# Patient Record
Sex: Female | Born: 1970 | Race: Black or African American | Hispanic: No | Marital: Married | Smoking: Never smoker
Health system: Southern US, Community
[De-identification: ages and names within clinical notes are randomized; demographics above are authoritative.]

## PROBLEM LIST (undated history)

## (undated) DIAGNOSIS — D649 Anemia, unspecified: Secondary | ICD-10-CM

## (undated) DIAGNOSIS — I10 Essential (primary) hypertension: Principal | ICD-10-CM

## (undated) DIAGNOSIS — R87629 Unspecified abnormal cytological findings in specimens from vagina: Secondary | ICD-10-CM

## (undated) HISTORY — DX: Morbid (severe) obesity due to excess calories: E66.01

## (undated) HISTORY — DX: Unspecified abnormal cytological findings in specimens from vagina: R87.629

## (undated) HISTORY — PX: TUBAL LIGATION: SHX77

## (undated) HISTORY — PX: KNEE ARTHROSCOPY WITH EXCISION BAKER'S CYST: SHX5646

## (undated) HISTORY — DX: Anemia, unspecified: D64.9

## (undated) HISTORY — DX: Essential (primary) hypertension: I10

---

## 2013-11-13 DIAGNOSIS — N83201 Unspecified ovarian cyst, right side: Secondary | ICD-10-CM | POA: Insufficient documentation

## 2013-12-11 DIAGNOSIS — N870 Mild cervical dysplasia: Secondary | ICD-10-CM | POA: Insufficient documentation

## 2014-08-03 LAB — HM PAP SMEAR: HM PAP: ABNORMAL

## 2015-05-27 ENCOUNTER — Emergency Department (HOSPITAL_BASED_OUTPATIENT_CLINIC_OR_DEPARTMENT_OTHER)
Admission: EM | Admit: 2015-05-27 | Discharge: 2015-05-27 | Disposition: A | Discharge status: Home / Self Care | Payer: No Typology Code available for payment source | Attending: Emergency Medicine | Admitting: Emergency Medicine

## 2015-05-27 ENCOUNTER — Emergency Department (HOSPITAL_BASED_OUTPATIENT_CLINIC_OR_DEPARTMENT_OTHER): Payer: No Typology Code available for payment source

## 2015-05-27 ENCOUNTER — Encounter (HOSPITAL_BASED_OUTPATIENT_CLINIC_OR_DEPARTMENT_OTHER): Payer: Self-pay | Admitting: *Deleted

## 2015-05-27 DIAGNOSIS — Y92410 Unspecified street and highway as the place of occurrence of the external cause: Secondary | ICD-10-CM | POA: Insufficient documentation

## 2015-05-27 DIAGNOSIS — Y9389 Activity, other specified: Secondary | ICD-10-CM | POA: Diagnosis not present

## 2015-05-27 DIAGNOSIS — Z3202 Encounter for pregnancy test, result negative: Secondary | ICD-10-CM | POA: Insufficient documentation

## 2015-05-27 DIAGNOSIS — Y998 Other external cause status: Secondary | ICD-10-CM | POA: Diagnosis not present

## 2015-05-27 DIAGNOSIS — Z79899 Other long term (current) drug therapy: Secondary | ICD-10-CM | POA: Insufficient documentation

## 2015-05-27 DIAGNOSIS — S39012A Strain of muscle, fascia and tendon of lower back, initial encounter: Secondary | ICD-10-CM

## 2015-05-27 DIAGNOSIS — I10 Essential (primary) hypertension: Secondary | ICD-10-CM | POA: Insufficient documentation

## 2015-05-27 DIAGNOSIS — S3992XA Unspecified injury of lower back, initial encounter: Secondary | ICD-10-CM | POA: Diagnosis present

## 2015-05-27 LAB — PREGNANCY, URINE: PREG TEST UR: NEGATIVE

## 2015-05-27 MED ORDER — IBUPROFEN 800 MG PO TABS: 800.0000 mg | ORAL_TABLET | Freq: Three times a day (TID) | ORAL | Status: DC | PRN

## 2015-05-27 MED ORDER — TRAMADOL HCL 50 MG PO TABS: 50.0000 mg | ORAL_TABLET | Freq: Four times a day (QID) | ORAL | Status: DC | PRN

## 2015-05-27 NOTE — Discharge Instructions (Signed)
Return here as needed.  Your x-rays were normal.  Use ice and heat on your lower back
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
## 2016-07-27 ENCOUNTER — Encounter (HOSPITAL_BASED_OUTPATIENT_CLINIC_OR_DEPARTMENT_OTHER): Payer: Self-pay | Admitting: *Deleted

## 2016-07-27 ENCOUNTER — Emergency Department (HOSPITAL_BASED_OUTPATIENT_CLINIC_OR_DEPARTMENT_OTHER)
Admission: EM | Admit: 2016-07-27 | Discharge: 2016-07-27 | Disposition: A | Discharge status: Home / Self Care | Payer: Commercial Managed Care - PPO | Attending: Emergency Medicine | Admitting: Emergency Medicine

## 2016-07-27 DIAGNOSIS — I10 Essential (primary) hypertension: Secondary | ICD-10-CM | POA: Insufficient documentation

## 2016-07-27 DIAGNOSIS — R8290 Unspecified abnormal findings in urine: Secondary | ICD-10-CM | POA: Insufficient documentation

## 2016-07-27 DIAGNOSIS — R1030 Lower abdominal pain, unspecified: Secondary | ICD-10-CM | POA: Diagnosis not present

## 2016-07-27 DIAGNOSIS — E860 Dehydration: Secondary | ICD-10-CM | POA: Diagnosis not present

## 2016-07-27 DIAGNOSIS — R3989 Other symptoms and signs involving the genitourinary system: Secondary | ICD-10-CM

## 2016-07-27 DIAGNOSIS — M545 Low back pain: Secondary | ICD-10-CM | POA: Diagnosis present

## 2016-07-27 DIAGNOSIS — Z79899 Other long term (current) drug therapy: Secondary | ICD-10-CM | POA: Insufficient documentation

## 2016-07-27 LAB — URINALYSIS, ROUTINE W REFLEX MICROSCOPIC
BILIRUBIN URINE: NEGATIVE
Glucose, UA: NEGATIVE mg/dL
HGB URINE DIPSTICK: NEGATIVE
KETONES UR: NEGATIVE mg/dL
Leukocytes, UA: NEGATIVE
Nitrite: NEGATIVE
PROTEIN: NEGATIVE mg/dL
SPECIFIC GRAVITY, URINE: 1.015 (ref 1.005–1.030)
pH: 6 (ref 5.0–8.0)

## 2016-07-27 MED ORDER — IBUPROFEN 400 MG PO TABS
400.0000 mg | ORAL_TABLET | Freq: Once | ORAL | Status: AC
  Administered 2016-07-27: 400 mg via ORAL
  Filled 2016-07-27: qty 1

## 2016-07-27 NOTE — ED Notes (Signed)
ED Provider at bedside. 

## 2016-07-27 NOTE — ED Triage Notes (Signed)
Back pain. Urine has a strong odor.

## 2016-07-27 NOTE — ED Provider Notes (Signed)
[facility] Provider Note   CSN: [id number] Arrival date & time: 07/27/16  1425  By signing my name below, I, Reola Mosher, attest that this documentation has been prepared under the direction and in the presence of Leo Grosser, MD. Electronically Signed: Reola Mosher, ED Scribe. 07/27/16. 5:07 PM.  History   Chief Complaint Chief Complaint  Patient presents with  . Back Pain   The history is provided by the patient. No language interpreter was used.  Back Pain   This is a new problem. The current episode started more than 2 days ago. The problem occurs constantly. The problem has been resolved. The pain is associated with no known injury. The pain is present in the lumbar spine. The pain does not radiate. The pain is moderate. Associated symptoms include abdominal pain (suprapubic). Pertinent negatives include no fever and no dysuria. Treatments tried: Azo. The treatment provided moderate relief.    HPI Comments: Tiffany Peterson is a 46 y.o. female with a h/o HTN, who presents to the Emergency Department complaining of resolved lower back pain beginning several days ago. She reports associated malodorous urine, concentrated urine, and mild suprapubic abdominal pain. She has been taking Azo at home with some moderate, temporary relief. Pt was also given Ibuprofen while in the ED with complete relief. Her pain was exacerbated with generalized movements. Pt has recently been non-compliant with her HTN medications. She denies vaginal discharge/bleeding, dysuria, fever, or any other associated symptoms.   Past Medical History:  Diagnosis Date  . Hypertension    There are no active problems to display for this patient.  History reviewed. No pertinent surgical history.  OB History    No data available     Home Medications    Prior to Admission medications   Medication Sig Start Date End Date Taking? Authorizing Provider  cloNIDine (CATAPRES) 0.1 MG tablet  Take 0.1 mg by mouth 2 (two) times daily.   Yes Historical Provider, MD  metoprolol succinate (TOPROL-XL) 25 MG 24 hr tablet Take 25 mg by mouth daily.   Yes Historical Provider, MD  ibuprofen (ADVIL,MOTRIN) 800 MG tablet Take 1 tablet (800 mg total) by mouth every 8 (eight) hours as needed. 05/27/15   Dalia Heading, PA-C  traMADol (ULTRAM) 50 MG tablet Take 1 tablet (50 mg total) by mouth every 6 (six) hours as needed. 05/27/15   Dalia Heading, PA-C   Family History No family history on file.  Social History Social History  Substance Use Topics  . Smoking status: Never Smoker  . Smokeless tobacco: Never Used  . Alcohol use No   Allergies   Patient has no known allergies.  Review of Systems Review of Systems  Constitutional: Negative for fever.  Gastrointestinal: Positive for abdominal pain (suprapubic).  Genitourinary: Negative for dysuria, vaginal bleeding and vaginal discharge.  Musculoskeletal: Positive for back pain (lower) and myalgias.  All other systems reviewed and are negative.  Physical Exam Updated Vital Signs BP (!) 190/117   Pulse 66   Temp 98.2 F (36.8 C) (Oral)   Resp 20   Ht 5\' 5"  (1.651 m)   Wt 230 lb (104.3 kg)   SpO2 100%   BMI 38.27 kg/m   Physical Exam  Constitutional: She is oriented to person, place, and time. She appears well-developed and well-nourished. No distress.  HENT:  Head: Normocephalic.  Nose: Nose normal.  Eyes: Conjunctivae are normal.  Neck: Neck supple. No tracheal deviation present.  Cardiovascular: Normal rate and  regular rhythm.   Pulmonary/Chest: Effort normal. No respiratory distress.  Abdominal: Soft. She exhibits no distension.  Neurological: She is alert and oriented to person, place, and time.  Skin: Skin is warm and dry.  Psychiatric: She has a normal mood and affect.  Nursing note and vitals reviewed.  ED Treatments / Results  DIAGNOSTIC STUDIES: Oxygen Saturation is 100% on RA, normal by my  interpretation.   COORDINATION OF CARE: 5:05 PM-Discussed next steps with pt. Pt verbalized understanding and is agreeable with the plan.   Labs (all labs ordered are listed, but only abnormal results are displayed) Labs Reviewed  URINALYSIS, ROUTINE W REFLEX MICROSCOPIC   EKG  EKG Interpretation None      Radiology No results found.  Procedures Procedures   Medications Ordered in ED Medications  ibuprofen (ADVIL,MOTRIN) tablet 400 mg (400 mg Oral Given 07/27/16 1447)   Initial Impression / Assessment and Plan / ED Course  I have reviewed the triage vital signs and the nursing notes.  Pertinent labs & imaging results that were available during my care of the patient were reviewed by me and considered in my medical decision making (see chart for details).     46 y.o. female presents with Lower abdominal discomfort and smelly urine that she states feels concentrated. She is premenstrual currently and feels like she gets cramps sometimes with this. She admits that she has not been drinking enough water and she has been noncompliant with her blood pressure medications because she does not like taking pills. She is hypertensive on arrival which is expected. She has complete resolution of her symptoms with ibuprofen, discussed increase water intake and other supportive care measures to help with mild dehydration and stress compliance with prescribed medications which she has a full supply of the upper establishment visit with primary care at [facility].  Final Clinical Impressions(s) / ED Diagnoses   Final diagnoses:  Dark yellow-colored urine  Mild dehydration   New Prescriptions Discharge Medication List as of 07/27/2016  5:33 PM     I personally performed the services described in this documentation, which was scribed in my presence. The recorded information has been reviewed and is accurate.      Leo Grosser, MD 07/27/16 [phone]

## 2016-08-03 ENCOUNTER — Encounter: Payer: Self-pay | Admitting: Family Medicine

## 2016-08-03 ENCOUNTER — Ambulatory Visit (INDEPENDENT_AMBULATORY_CARE_PROVIDER_SITE_OTHER): Payer: Commercial Managed Care - PPO | Admitting: Family Medicine

## 2016-08-03 ENCOUNTER — Other Ambulatory Visit: Payer: Self-pay | Admitting: Family Medicine

## 2016-08-03 VITALS — BP 159/99 | HR 55 | Temp 97.9°F | Ht 65.0 in | Wt 234.4 lb

## 2016-08-03 DIAGNOSIS — R0683 Snoring: Secondary | ICD-10-CM

## 2016-08-03 DIAGNOSIS — Z8639 Personal history of other endocrine, nutritional and metabolic disease: Secondary | ICD-10-CM | POA: Diagnosis not present

## 2016-08-03 DIAGNOSIS — R0681 Apnea, not elsewhere classified: Secondary | ICD-10-CM

## 2016-08-03 DIAGNOSIS — R0609 Other forms of dyspnea: Secondary | ICD-10-CM | POA: Diagnosis not present

## 2016-08-03 DIAGNOSIS — I10 Essential (primary) hypertension: Secondary | ICD-10-CM | POA: Diagnosis not present

## 2016-08-03 MED ORDER — LISINOPRIL 20 MG PO TABS: 20.0000 mg | ORAL_TABLET | Freq: Every day | ORAL | 3 refills | Status: DC

## 2016-08-03 NOTE — Progress Notes (Signed)
Pre visit review using our clinic review tool, if applicable. No additional management support is needed unless otherwise documented below in the visit note. 

## 2016-08-03 NOTE — Patient Instructions (Addendum)
Around 3 times per week, check your blood pressure 4 times per day. Twice in the morning and twice in the evening. The readings should be at least one minute apart. Write down these values and bring them to your next nurse visit/appointment.  When you check your BP, make sure you have been doing something calm/relaxing 5 minutes prior to checking. Both feet should be flat on the floor and you should be sitting. Use your left arm and make sure it is in a relaxed position (on a table), and that the cuff is at the approximate level/height of your heart.  Aim to do some physical exertion for 150 minutes per week. This is typically divided into 5 days per week, 30 minutes per day. The activity should be enough to get your heart rate up. Anything is better than nothing if you have time constraints.  If you don't hear anything in the next 1-2 weeks about your referral to a sleep specialist, call our office for an update.  Healthy Eating Plan Many factors influence your heart health, including eating and exercise habits. Heart (coronary) risk increases with abnormal blood fat (lipid) levels. Heart-healthy meal planning includes limiting unhealthy fats, increasing healthy fats, and making other small dietary changes. This includes maintaining a healthy body weight to help keep lipid levels within a normal range.  WHAT IS MY PLAN?  Your health care provider recommends that you:  Drink a glass of water before meals to help with satiety.  Eat slowly.  An alternative to the water is to add Metamucil. This will help with satiety as well. It does contain calories, unlike water.  WHAT TYPES OF FAT SHOULD I CHOOSE?  Choose healthy fats more often. Choose monounsaturated and polyunsaturated fats, such as olive oil and canola oil, flaxseeds, walnuts, almonds, and seeds.  Eat more omega-3 fats. Good choices include salmon, mackerel, sardines, tuna, flaxseed oil, and ground flaxseeds. Aim to eat fish at least two  times each week.  Avoid foods with partially hydrogenated oils in them. These contain trans fats. Examples of foods that contain trans fats are stick margarine, some tub margarines, cookies, crackers, and other baked goods. If you are going to avoid a fat, this is the one to avoid!  WHAT GENERAL GUIDELINES DO I NEED TO FOLLOW?  Check food labels carefully to identify foods with trans fats. Avoid these types of options when possible.  Fill one half of your plate with vegetables and green salads. Eat 4-5 servings of vegetables per day. A serving of vegetables equals 1 cup of raw leafy vegetables,  cup of raw or cooked cut-up vegetables, or  cup of vegetable juice.  Fill one fourth of your plate with whole grains. Look for the word "whole" as the first word in the ingredient list.  Fill one fourth of your plate with lean protein foods.  Eat 4-5 servings of fruit per day. A serving of fruit equals one medium whole fruit,  cup of dried fruit,  cup of fresh, frozen, or canned fruit. Try to avoid fruits in cups/syrups as the sugar content can be high.  Eat more foods that contain soluble fiber. Examples of foods that contain this type of fiber are apples, broccoli, carrots, beans, peas, and barley. Aim to get 20-30 g of fiber per day.  Eat more home-cooked food and less restaurant, buffet, and fast food.  Limit or avoid alcohol.  Limit foods that are high in starch and sugar.  Avoid fried foods when  able.  [place] foods by using methods other than frying. Baking, boiling, grilling, and broiling are all great options. Other fat-reducing suggestions include: ? Removing the skin from poultry. ? Removing all visible fats from meats. ? Skimming the fat off of stews, soups, and gravies before serving them. ? Steaming vegetables in water or broth.  Lose weight if you are overweight. Losing just 5-10% of your initial body weight can help your overall health and prevent diseases such as diabetes and  heart disease.  Increase your consumption of nuts, legumes, and seeds to 4-5 servings per week. One serving of dried beans or legumes equals  cup after being cooked, one serving of nuts equals 1 ounces, and one serving of seeds equals  ounce or 1 tablespoon.  WHAT ARE GOOD FOODS CAN I EAT? Grains Grainy breads (try to find bread that is 3 g of fiber per slice or greater), oatmeal, light popcorn. Whole-grain cereals. Rice and pasta, including brown rice and those that are made with whole wheat. Edamame pasta is a great alternative to grain pasta. It has a higher protein content. Try to avoid significant consumption of white bread, sugary cereals, or pastries/baked goods.  Vegetables All vegetables. Cooked white potatoes do not count as vegetables.  Fruits All fruits, but limit pineapple and bananas as these fruits have a higher sugar content.  Meats and Other Protein Sources Lean, well-trimmed beef, veal, pork, and lamb. Chicken and [place] without skin. All fish and shellfish. Wild duck, rabbit, pheasant, and venison. Egg whites or low-cholesterol egg substitutes. Dried beans, peas, lentils, and tofu.Seeds and most nuts.  Dairy Low-fat or nonfat cheeses, including ricotta, string, and mozzarella. Skim or 1% milk that is liquid, powdered, or evaporated. Buttermilk that is made with low-fat milk. Nonfat or low-fat yogurt. Soy/Almond milk are good alternatives if you cannot handle dairy.  Beverages Water is the best for you. Sports drinks with less sugar are more desirable unless you are a highly active athlete.  Sweets and Desserts Sherbets and fruit ices. Honey, jam, marmalade, jelly, and syrups. Dark chocolate.  Eat all sweets and desserts in moderation.  Fats and Oils Nonhydrogenated (trans-free) margarines. Vegetable oils, including soybean, sesame, sunflower, olive, peanut, safflower, corn, canola, and cottonseed. Salad dressings or mayonnaise that are made with a vegetable oil.  Limit added fats and oils that you use for cooking, baking, salads, and as spreads.  Other Cocoa powder. Coffee and tea. Most condiments.  The items listed above may not be a complete list of recommended foods or beverages. Contact your dietitian for more options.

## 2016-08-03 NOTE — Progress Notes (Signed)
Chief Complaint  Patient presents with  . Establish Care    Pt reports having HTN and needs to be put on medication       New Patient Visit SUBJECTIVE: HPI: Tiffany Peterson is an 46 y.o.female who is being seen for establishing care.  Hypertension Patient has hx of HTN. She does not routinely monitor home blood pressures. She is compliant with medications- Clonidine BID and Metoprolol 25 mg. Patient has these side effects of medication: none She is not adhering to a healthy diet overall. Exercise: None She has had a tubal ligation. Denies chest pain, she does have some shortness of breath with physical exertion. Of note, she has been experiencing a strong urge to eat ice. She has a hx of Fe deficiency.  No Known Allergies  Past Medical History:  Diagnosis Date  . Hypertension    Past Surgical History:  Procedure Laterality Date  . NO PAST SURGERIES     Social History   Social History  . Marital status: Married   Social History Main Topics  . Smoking status: Never Smoker  . Smokeless tobacco: Never Used  . Alcohol use No  . Drug use: Unknown   Family History  Problem Relation Age of Onset  . Cancer Neg Hx      Current Outpatient Prescriptions:  .  metoprolol succinate (TOPROL-XL) 25 MG 24 hr tablet, Take 25 mg by mouth daily., Disp: , Rfl:  .  lisinopril (PRINIVIL,ZESTRIL) 20 MG tablet, Take 1 tablet (20 mg total) by mouth daily., Disp: 90 tablet, Rfl: 3  Patient's last menstrual period was 06/22/2016.  ROS Cardiovascular: Denies chest pain  Respiratory: Denies current SOB   OBJECTIVE: BP (!) 159/99 (BP Location: Left Arm, Patient Position: Sitting, Cuff Size: Large)   Pulse (!) 55   Temp 97.9 F (36.6 C) (Oral)   Ht 5\' 5"  (1.651 m)   Wt 234 lb 6.4 oz (106.3 kg)   LMP 06/22/2016   SpO2 98%   BMI 39.01 kg/m   Constitutional: -  VS reviewed -  Well developed, well nourished, appears stated age -  No apparent distress  Psychiatric: -  Oriented to  person, place, and time -  Memory intact -  Affect and mood normal -  Fluent conversation, good eye contact -  Judgment and insight age appropriate  Eye: -  Conjunctivae clear, no discharge -  Pupils symmetric, round, reactive to light  ENMT: -  Oral mucosa without lesions, tongue and uvula midline    Tonsils not enlarged, no erythema, no exudate, trachea midline    Pharynx moist, no lesions, no erythema  Neck: -  No gross swelling, no palpable masses -  Thyroid midline, not enlarged, mobile, no palpable masses  Cardiovascular: -  RRR, no murmurs -  No LE edema  Respiratory: -  Normal respiratory effort, no accessory muscle use, no retraction -  Breath sounds equal, no wheezes, no ronchi, no crackles  Gastrointestinal: -  Bowel sounds normal -  No tenderness, no distention, no guarding, no masses  Musculoskeletal: -  No clubbing, no cyanosis -  Gait normal  Skin: -  No significant lesion on inspection -  Warm and dry to palpation   ASSESSMENT/PLAN: Essential hypertension - Plan: Basic Metabolic Panel (BMET), Basic Metabolic Panel (BMET)  Snoring - Plan: Ambulatory referral to Pulmonology  Witnessed apneic spells - Plan: Ambulatory referral to Pulmonology  DOE (dyspnea on exertion) - Plan: CBC, EKG 12-Lead  History of iron deficiency - Plan:  CBC, IBC panel, Ferritin  Pt follows with OB/GYN for women's health and has yearly mammograms through work. She also gets routine lab work through her job. She will get our office copies of her most recent labs. Orders as above. Stop clonidine, add lisinopril. Counseled on diet and exercise. Losing weight could affect likely sleep apnea which could also be affecting her blood pressure. She will start keeping track of her blood pressure home and bring both her monitor and log to her next appointment. Patient should return in 1 week for labs, 2 weeks to see me for blood pressure. The patient voiced understanding and agreement to the  plan.   [place], DO 08/03/16  4:47 PM

## 2016-08-04 LAB — CBC
HEMATOCRIT: 33.5 % — AB (ref 36.0–46.0)
Hemoglobin: 10.3 g/dL — ABNORMAL LOW (ref 12.0–15.0)
MCHC: 30.7 g/dL (ref 30.0–36.0)
PLATELETS: 336 10*3/uL (ref 150.0–400.0)
RBC: 4.92 Mil/uL (ref 3.87–5.11)
RDW: 19 % — ABNORMAL HIGH (ref 11.5–15.5)
WBC: 6.4 10*3/uL (ref 4.0–10.5)

## 2016-08-04 LAB — BASIC METABOLIC PANEL
BUN: 14 mg/dL (ref 6–23)
CHLORIDE: 104 meq/L (ref 96–112)
CO2: 23 meq/L (ref 19–32)
Calcium: 8.8 mg/dL (ref 8.4–10.5)
Creatinine, Ser: 0.86 mg/dL (ref 0.40–1.20)
GFR: 90.58 mL/min (ref >60.00)
GLUCOSE: 78 mg/dL (ref 70–99)
POTASSIUM: 4.2 meq/L (ref 3.5–5.1)
SODIUM: 136 meq/L (ref 135–145)

## 2016-08-04 LAB — FERRITIN: Ferritin: 6 ng/mL — ABNORMAL LOW (ref 10.0–291.0)

## 2016-08-04 LAB — IBC PANEL
Iron: 14 ug/dL — ABNORMAL LOW (ref 42–145)
Saturation Ratios: 2.9 % — ABNORMAL LOW (ref 20.0–50.0)
Transferrin: 346 mg/dL (ref 212.0–360.0)

## 2016-08-10 ENCOUNTER — Other Ambulatory Visit (INDEPENDENT_AMBULATORY_CARE_PROVIDER_SITE_OTHER): Payer: Commercial Managed Care - PPO

## 2016-08-10 DIAGNOSIS — I10 Essential (primary) hypertension: Secondary | ICD-10-CM | POA: Diagnosis not present

## 2016-08-11 LAB — BASIC METABOLIC PANEL WITH GFR
BUN: 16 mg/dL (ref 6–23)
CO2: 23 meq/L (ref 19–32)
Calcium: 9.2 mg/dL (ref 8.4–10.5)
Chloride: 105 meq/L (ref 96–112)
Creatinine, Ser: 0.92 mg/dL (ref 0.40–1.20)
GFR: 83.8 mL/min
Glucose, Bld: 77 mg/dL (ref 70–99)
Potassium: 4.4 meq/L (ref 3.5–5.1)
Sodium: 139 meq/L (ref 135–145)

## 2016-08-12 ENCOUNTER — Encounter: Payer: Self-pay | Admitting: Family Medicine

## 2016-08-12 ENCOUNTER — Telehealth: Payer: Self-pay | Admitting: Family Medicine

## 2016-08-12 NOTE — Telephone Encounter (Addendum)
Labs-08/10/16 Lipid panel: Tot chol 157, HDL 53, TGs 51; 10 yr CVD is 3.0% CMP unremarkable A1c 5.8 CBC: Hb 9, Hct 31.9, MCV 66.7, RDW 18.8 Vit D: 10 TSH WNL

## 2016-08-17 ENCOUNTER — Encounter: Payer: Self-pay | Admitting: *Deleted

## 2016-08-19 ENCOUNTER — Ambulatory Visit (INDEPENDENT_AMBULATORY_CARE_PROVIDER_SITE_OTHER): Payer: Commercial Managed Care - PPO | Admitting: Family Medicine

## 2016-08-19 ENCOUNTER — Encounter: Payer: Self-pay | Admitting: Family Medicine

## 2016-08-19 VITALS — BP 161/92 | HR 64 | Temp 98.3°F | Resp 16 | Ht 65.0 in | Wt 233.0 lb

## 2016-08-19 DIAGNOSIS — I10 Essential (primary) hypertension: Secondary | ICD-10-CM | POA: Diagnosis not present

## 2016-08-19 HISTORY — DX: Essential (primary) hypertension: I10

## 2016-08-19 MED ORDER — CHLORTHALIDONE 25 MG PO TABS: 25.0000 mg | ORAL_TABLET | Freq: Every day | ORAL | 2 refills | Status: DC

## 2016-08-19 NOTE — Progress Notes (Signed)
Chief Complaint  Patient presents with  . Hypertension    Subjective Tiffany Peterson is a 46 y.o. female who presents for hypertension follow up. She does monitor home blood pressures.  Blood pressures ranging from 140-150's/90-100's on average. She is compliant with medications- Metoprolol XR 25 mg daily, Lisinopril 20 mg daily. Patient has these side effects of medication: none She is not adhering to a healthy diet overall. Current exercise: none; was counseled on waking up earlier to work out before work but she needs to reports at 6 AM   Past Medical History:  Diagnosis Date  . Essential hypertension 08/19/2016  . Hypertension    Family History  Problem Relation Age of Onset  . Cancer Neg Hx    Medications Current Outpatient Prescriptions on File Prior to Visit  Medication Sig Dispense Refill  . lisinopril (PRINIVIL,ZESTRIL) 20 MG tablet Take 1 tablet (20 mg total) by mouth daily. 90 tablet 3  . metoprolol succinate (TOPROL-XL) 25 MG 24 hr tablet Take 25 mg by mouth daily.     Allergies No Known Allergies  Review of Systems Cardiovascular: no chest pain Respiratory:  no shortness of breath  Exam BP (!) 161/92 (BP Location: Left Arm)   Pulse 64   Temp 98.3 F (36.8 C) (Oral)   Resp 16   Ht 5\' 5"  (1.651 m)   Wt 233 lb (105.7 kg)   SpO2 100%   BMI 38.77 kg/m  General:  well developed, well nourished, in no apparent distress Skin:  warm, no pallor or diaphoresis Heart :RRR, no murmurs, no bruits, no LE edema Lungs:  clear to auscultation, no accessory muscle use Psych: well oriented with normal range of affect and appropriate judgment/insight  Essential hypertension - Plan: chlorthalidone (HYGROTON) 25 MG tablet, Basic metabolic panel, Basic metabolic panel  Uncontrolled. Orders as above. Add chlorthalidone. BMP today and in 1 week for monitoring renal function and K. Counseled on diet and exercise. Give me something for exercise, even if for 1 min.  F/u in 1  mo. The patient voiced understanding and agreement to the plan.  [place], DO 08/19/16  4:27 PM

## 2016-08-19 NOTE — Patient Instructions (Signed)
Consider cutting down on your lemonade consumption.  Start exercising daily.

## 2016-08-20 LAB — BASIC METABOLIC PANEL
BUN: 16 mg/dL (ref 6–23)
CHLORIDE: 104 meq/L (ref 96–112)
CO2: 29 meq/L (ref 19–32)
Calcium: 9.1 mg/dL (ref 8.4–10.5)
Creatinine, Ser: 0.86 mg/dL (ref 0.40–1.20)
GFR: 90.57 mL/min (ref >60.00)
GLUCOSE: 100 mg/dL — AB (ref 70–99)
Potassium: 4.4 mEq/L (ref 3.5–5.1)
SODIUM: 137 meq/L (ref 135–145)

## 2016-09-14 ENCOUNTER — Encounter: Payer: Self-pay | Admitting: Pulmonary Disease

## 2016-09-14 ENCOUNTER — Ambulatory Visit (INDEPENDENT_AMBULATORY_CARE_PROVIDER_SITE_OTHER): Payer: Commercial Managed Care - PPO | Admitting: Pulmonary Disease

## 2016-09-14 DIAGNOSIS — R0981 Nasal congestion: Secondary | ICD-10-CM

## 2016-09-14 DIAGNOSIS — G4700 Insomnia, unspecified: Secondary | ICD-10-CM | POA: Diagnosis not present

## 2016-09-14 DIAGNOSIS — G4710 Hypersomnia, unspecified: Secondary | ICD-10-CM

## 2016-09-14 NOTE — Assessment & Plan Note (Signed)
Patient with sleep onset and sleep maintenance insomnia most likely from untreated sleep apnea. She takes Ambien every now and then but has abnormal behavior with Ambien use. Sleep talking and drove once.  I recommended to hold off on the Ambien. Hopefully, once on CPAP, her insomnia will improve. May try Benadryl for sleep onset insomnia.

## 2016-09-14 NOTE — Assessment & Plan Note (Signed)
Has chronic sinus issues.  Start claritin daily.

## 2016-09-14 NOTE — Assessment & Plan Note (Addendum)
Pt  has snoring, gasping, choking, witnessed apneas, has frequent awakenings, has issues falling and staying asleep. She sleeps from 7pm - 5am but is awake for the most part.  Has fatigue and hypersomnia when she wakes up in the morning. Denies HA in am. Gtes sleepy in pm.  Naps in pm. Hypersomnia affects her fxnality.  She sleep talks.  No other abn behavior in sleep.   She takes [place] and does abnormal behavior in sleep (talk, walk, conversing). Last time she took it was 4 months ago. Has issues falling asleep.   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   ESS 17.    She has chronic sinus issues.    Plan :  We discussed about the diagnosis of Obstructive Sleep Apnea (OSA) and implications of untreated OSA. We discussed about CPAP and BiPaP as possible treatment options.    We will schedule the patient for a sleep study. Plan for a HST. Very symptomatic. Anticipate no issues with CPAP. She also has abnormal behavior with Ambien. Hopefully, with CPAP and treatment of sleep apnea, she will not necessarily need sleep aids. Has sinus issues for which we will start claritin.    Patient was instructed to call the office if he/she has not heard back from the office 1-2 weeks after the sleep study.   Patient was instructed to call the office if he/she is having issues with the PAP device.   We discussed good sleep hygiene.   Patient was advised not to engage in activities requiring concentration and/or vigilance if he/she is sleepy.  Patient was advised not to drive if he/she is sleepy.

## 2016-09-14 NOTE — Patient Instructions (Signed)

## 2016-09-14 NOTE — Progress Notes (Signed)
Subjective:    Patient ID: Tiffany Peterson, female    DOB: [birth year], 46 y.o.   MRN: [id number]  HPI   This is the case of Tiffany Peterson, 46 y.o. Female, who was referred by Dr. Riki Sheer in consultation regarding possible OSA.   As you very well know, patient does not smoke, denies asthma or copd. She has snoring, gasping, choking, witnessed apneas, has frequent awakenings, has issues falling and staying asleep. She sleeps from 7pm - 5am but is awake for the most part.  Has fatigue and hypersomnia when she wakes up in the morning. Denies HA in am. Gtes sleepy in pm.  Naps in pm. Hypersomnia affects her fxnality.  She sleep talks.  No other abn behavior in sleep.   She takes [place] and does abnormal behavior in sleep (talk, walk, conversing). Last time she took it was 4 months ago. Has issues falling asleep.   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   ESS 17.    She has chronic sinus issues.   Review of Systems  Constitutional: Negative.  Negative for fever and unexpected weight change.  HENT: Negative.  Negative for congestion, dental problem, ear pain, nosebleeds, postnasal drip, rhinorrhea, sinus pressure, sneezing, sore throat and trouble swallowing.   Eyes: Negative.  Negative for redness and itching.  Respiratory: Positive for shortness of breath. Negative for cough, chest tightness and wheezing.   Cardiovascular: Negative.  Negative for palpitations and leg swelling.  Gastrointestinal: Negative.  Negative for nausea and vomiting.  Endocrine: Negative.   Genitourinary: Negative.  Negative for dysuria.  Musculoskeletal: Negative.  Negative for joint swelling.  Skin: Negative.  Negative for rash.  Allergic/Immunologic: Negative.  Negative for environmental allergies, food allergies and immunocompromised state.  Neurological: Negative.  Negative for headaches.  Hematological: Bruises/bleeds easily.  Psychiatric/Behavioral: Negative.  Negative  for dysphoric mood. The patient is not nervous/anxious.    Past Medical History:  Diagnosis Date  . Essential hypertension 08/19/2016  . Hypertension    (-) CA, DVT.   Family History  Problem Relation Age of Onset  . Cancer Neg Hx      Past Surgical History:  Procedure Laterality Date  . TUBAL LIGATION      Social History   Social History  . Marital status: Married    Spouse name: N/A  . Number of children: N/A  . Years of education: N/A   Occupational History  . Not on file.   Social History Main Topics  . Smoking status: Never Smoker  . Smokeless tobacco: Never Used  . Alcohol use No  . Drug use: Unknown  . Sexual activity: Not on file   Other Topics Concern  . Not on file   Social History Narrative  . No narrative on file   Lives in [place].   No Known Allergies   Outpatient Medications Prior to Visit  Medication Sig Dispense Refill  . chlorthalidone (HYGROTON) 25 MG tablet Take 1 tablet (25 mg total) by mouth daily. 30 tablet 2  . lisinopril (PRINIVIL,ZESTRIL) 20 MG tablet Take 1 tablet (20 mg total) by mouth daily. (Patient not taking: Reported on 09/14/2016) 90 tablet 3  . metoprolol succinate (TOPROL-XL) 25 MG 24 hr tablet Take 25 mg by mouth daily.     No facility-administered medications prior to visit.    No orders of the defined types were placed in this encounter.       Objective:   Physical  Exam  Vitals:  Vitals:   09/14/16 1438  BP: 132/88  Pulse: 63  SpO2: 98%  Weight: 231 lb 3.2 oz (104.9 kg)  Height: 5\' 5"  (1.651 m)    Constitutional/General:  Pleasant, well-nourished, well-developed, not in any distress,  Comfortably seating.  Well kempt  Body mass index is 38.47 kg/m. Wt Readings from Last 3 Encounters:  09/14/16 231 lb 3.2 oz (104.9 kg)  08/19/16 233 lb (105.7 kg)  08/03/16 234 lb 6.4 oz (106.3 kg)     HEENT: Pupils equal and reactive to light and accommodation. Anicteric sclerae. Normal nasal mucosa.   No oral   lesions,  mouth clear,  oropharynx clear, no postnasal drip. (-) Oral thrush. No dental caries.  Airway - Mallampati class III- IV  Neck: No masses. Midline trachea. No JVD, (-) LAD. (-) bruits appreciated.  Respiratory/Chest: Grossly normal chest. (-) deformity. (-) Accessory muscle use.  Symmetric expansion. (-) Tenderness on palpation.  Resonant on percussion.  Diminished BS on both lower lung zones. (-) wheezing, crackles, rhonchi (-) egophony  Cardiovascular: Regular rate and  rhythm, heart sounds normal, no murmur or gallops, no peripheral edema  Gastrointestinal:  Normal bowel sounds. Soft, non-tender. No hepatosplenomegaly.  (-) masses.   Musculoskeletal:  Normal muscle tone. Normal gait.   Extremities: Grossly normal. (-) clubbing, cyanosis.  (-) edema  Skin: (-) rash,lesions seen.   Neurological/Psychiatric : alert, oriented to time, place, person. Normal mood and affect          Assessment & Plan:  Hypersomnia Pt  has snoring, gasping, choking, witnessed apneas, has frequent awakenings, has issues falling and staying asleep. She sleeps from 7pm - 5am but is awake for the most part.  Has fatigue and hypersomnia when she wakes up in the morning. Denies HA in am. Gtes sleepy in pm.  Naps in pm. Hypersomnia affects her fxnality.  She sleep talks.  No other abn behavior in sleep.   She takes [place] and does abnormal behavior in sleep (talk, walk, conversing). Last time she took it was 4 months ago. Has issues falling asleep.   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   ESS 17.    She has chronic sinus issues.    Plan :  We discussed about the diagnosis of Obstructive Sleep Apnea (OSA) and implications of untreated OSA. We discussed about CPAP and BiPaP as possible treatment options.    We will schedule the patient for a sleep study. Plan for a HST. Very symptomatic. Anticipate no issues with CPAP. She also has abnormal behavior with  Ambien. Hopefully, with CPAP and treatment of sleep apnea, she will not necessarily need sleep aids. Has sinus issues for which we will start claritin.    Patient was instructed to call the office if he/she has not heard back from the office 1-2 weeks after the sleep study.   Patient was instructed to call the office if he/she is having issues with the PAP device.   We discussed good sleep hygiene.   Patient was advised not to engage in activities requiring concentration and/or vigilance if he/she is sleepy.  Patient was advised not to drive if he/she is sleepy.    Sinus congestion Has chronic sinus issues.  Start claritin daily.   Insomnia Patient with sleep onset and sleep maintenance insomnia most likely from untreated sleep apnea. She takes Ambien every now and then but has abnormal behavior with Ambien use. Sleep talking and drove once.  I recommended to hold off on the Ambien. Hopefully, once on CPAP, her insomnia will improve. May try Benadryl for sleep onset insomnia.     Thank you very much for letting me participate in this patient's care. Please do not hesitate to give me a call if you have any questions or concerns regarding the treatment plan.   Patient will follow up with me in 8-10 weeks.     Monica Becton, MD 09/14/2016   3:20 PM [facility] [place] Pager: [phone] Office: [phone], Fax: [phone]

## 2016-09-18 ENCOUNTER — Ambulatory Visit (INDEPENDENT_AMBULATORY_CARE_PROVIDER_SITE_OTHER): Payer: Commercial Managed Care - PPO | Admitting: Family Medicine

## 2016-09-18 ENCOUNTER — Encounter: Payer: Self-pay | Admitting: Family Medicine

## 2016-09-18 VITALS — BP 168/108 | HR 69 | Temp 98.3°F | Ht 65.0 in | Wt 229.6 lb

## 2016-09-18 DIAGNOSIS — Z23 Encounter for immunization: Secondary | ICD-10-CM

## 2016-09-18 DIAGNOSIS — I10 Essential (primary) hypertension: Secondary | ICD-10-CM

## 2016-09-18 NOTE — Addendum Note (Signed)
Addended by: Harl Bowie on: 09/18/2016 04:14 PM   Modules accepted: Orders

## 2016-09-18 NOTE — Patient Instructions (Addendum)
Start back on Lisinopril and Toprol. Continue with Chlorthalidone also.  Keep checking your blood pressure. Bring your tablet and your monitor to your appointment so we can compare and make sure your home monitor is accurate. Follow up is based on what your readings are and the nurse visit.  We are always here if you need [place].

## 2016-09-18 NOTE — Progress Notes (Signed)
Pre visit review using our clinic review tool, if applicable. No additional management support is needed unless otherwise documented below in the visit note. 

## 2016-09-18 NOTE — Progress Notes (Addendum)
Chief Complaint  Patient presents with  . Follow-up    1 mos-on BP-pt forgot the BP readings    Subjective Tiffany Peterson is a 46 y.o. female who presents for hypertension follow up. She does monitor home blood pressures. Blood pressures ranging from 160's/100's on average. She is compliant with medications-recently started on Chlorthalidone 25 mg daily.   Patient has these side effects of medication: none She is sometimes adhering to a healthy diet overall. Current exercise: none     Past Medical History:  Diagnosis Date  . Essential hypertension 08/19/2016   Family History  Problem Relation Age of Onset  . Cancer Neg Hx      Medications Current Outpatient Prescriptions on File Prior to Visit  Medication Sig Dispense Refill  . chlorthalidone (HYGROTON) 25 MG tablet Take 1 tablet (25 mg total) by mouth daily. 30 tablet 2  . lisinopril (PRINIVIL,ZESTRIL) 20 MG tablet Take 1 tablet (20 mg total) by mouth daily. (Patient not taking: Reported on 09/14/2016) 90 tablet 3  . metoprolol succinate (TOPROL-XL) 25 MG 24 hr tablet Take 25 mg by mouth daily.     Allergies No Known Allergies  Review of Systems Cardiovascular: no chest pain Respiratory:  no shortness of breath  Exam BP (!) 168/108 (BP Location: Left Wrist, Patient Position: Sitting, Cuff Size: Normal)   Pulse 69   Temp 98.3 F (36.8 C) (Oral)   Ht 5\' 5"  (1.651 m)   Wt 229 lb 9.6 oz (104.1 kg)   LMP 08/26/2016 (Approximate)   SpO2 99%   BMI 38.21 kg/m  General:  well developed, well nourished, in no apparent distress Skin:  warm, no pallor or diaphoresis Eyes:  pupils equal and round, sclera anicteric without injection Heart :RRR, no murmurs, no bruits, no LE edema Lungs:  clear to auscultation, no accessory muscle use Psych: well oriented with normal range of affect and appropriate judgment/insight, did become tearful during exam when briefly bringing up PHQ screening  Essential hypertension  Status:  uncontrolled Accidental noncompliance, will restart medicine- Lisinopril and Toprol XL. Continue on Chlorthalidone. Counseled on diet and exercise Pt's PHQ-2 suggestive of at least discussion for depression, pt did not wish to discuss this at today's visit. I iterated that we will be here if/when she ever decides to discuss the issue at hand. F/u in 2 weeks for nurse visit, bring tablet with readings and home BP monitor. The patient voiced understanding and agreement to the plan.  [place], DO 09/18/16  3:19 PM

## 2016-10-02 ENCOUNTER — Other Ambulatory Visit: Payer: Self-pay

## 2016-10-02 ENCOUNTER — Ambulatory Visit (INDEPENDENT_AMBULATORY_CARE_PROVIDER_SITE_OTHER): Payer: Commercial Managed Care - PPO | Admitting: Family Medicine

## 2016-10-02 DIAGNOSIS — I10 Essential (primary) hypertension: Secondary | ICD-10-CM

## 2016-10-02 MED ORDER — LOSARTAN POTASSIUM 100 MG PO TABS: 100.0000 mg | ORAL_TABLET | Freq: Every day | ORAL | 3 refills | Status: DC

## 2016-10-02 NOTE — Progress Notes (Signed)
BP is greatly improved. Change ACEi to ARB given scratchy cough. Agree with above otherwise.

## 2016-10-02 NOTE — Patient Instructions (Signed)
Per Dr. Nani Ravens patient to stop Lisnopril 20 mg and start Losartan 100 mg daily along with Chlorthalidone 25 mg daily. Return for BP Nurse visit in 1 month. Have Home BP monitor  re-calibrated or consider purchasing new one.   Appointment scheduled for Nov 03, 2016 @ 3:00 pm

## 2016-10-02 NOTE — Progress Notes (Signed)
Pre visit review using our clinic tool,if applicable. No additional management support is needed unless otherwise documented below in the visit note. 20 mg started  Patient in fore BP check today per order from Dr. Deloris Ping dated 09/18/16.  Last BP = 168/108 Pulse = 69. Lisinopril 20 mg qd and Toprol XL 25 mg re-started contiued Chlorthalidone 25 mg daily.  Patient states she did not re-start Toprol - XL.  States she has developed a scratchy throat since starting Lisinopril.  BP today =  131/81 P = 63    Patient BP Cuff BP=135/100  P= 62  Per Dr. Nani Ravens patient to stop Lisnopril 20 mg and start Losartan 100 mg daily along with Chlorthalidone 25 mg daily. Return for BP Nurse visit in 1 month. Have Home BP monitor  re-calibrated or consider purchasing new one.

## 2016-10-12 DIAGNOSIS — G4733 Obstructive sleep apnea (adult) (pediatric): Secondary | ICD-10-CM | POA: Diagnosis not present

## 2016-10-16 ENCOUNTER — Other Ambulatory Visit: Payer: Self-pay | Admitting: *Deleted

## 2016-10-16 ENCOUNTER — Telehealth: Payer: Self-pay | Admitting: Pulmonary Disease

## 2016-10-16 DIAGNOSIS — G4710 Hypersomnia, unspecified: Secondary | ICD-10-CM

## 2016-10-16 DIAGNOSIS — G4733 Obstructive sleep apnea (adult) (pediatric): Secondary | ICD-10-CM | POA: Diagnosis not present

## 2016-10-16 NOTE — Telephone Encounter (Signed)
    Please call the pt and tell the pt the [place]   did not show OSA.   Pt stops breathing  3  times an hour.   Home sleep study was done on : 10/12/16  We need to make sure she does not have OSA so I recommend a sleep lab study.  Pls schedule a lab study if it will be OK with the pt.     Thanks!   J. Shirl Harris, MD 10/16/2016, 5:14 AM

## 2016-10-19 NOTE — Telephone Encounter (Signed)
Spoke with pt and informed her of her sleep study results per AD. Pt at this time decided to hold off repeat sleep study at this time. She will call us if she needs anything further

## 2016-11-16 ENCOUNTER — Ambulatory Visit: Payer: Commercial Managed Care - PPO | Admitting: Adult Health
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
## 2018-09-07 ENCOUNTER — Other Ambulatory Visit: Payer: Self-pay

## 2018-09-07 ENCOUNTER — Ambulatory Visit (INDEPENDENT_AMBULATORY_CARE_PROVIDER_SITE_OTHER): Payer: Commercial Managed Care - PPO | Admitting: Family Medicine

## 2018-09-07 ENCOUNTER — Encounter: Payer: Self-pay | Admitting: Family Medicine

## 2018-09-07 DIAGNOSIS — I10 Essential (primary) hypertension: Secondary | ICD-10-CM

## 2018-09-07 DIAGNOSIS — R0600 Dyspnea, unspecified: Secondary | ICD-10-CM

## 2018-09-07 MED ORDER — AMLODIPINE BESYLATE 5 MG PO TABS: 5.0000 mg | ORAL_TABLET | Freq: Every day | ORAL | 3 refills | Status: DC

## 2018-09-07 NOTE — Progress Notes (Signed)
Virtual Visit via Telephone Note  I connected with Tiffany Peterson on 09/07/18 at  1:30 PM EDT by telephone and verified that I am speaking with the correct person using two identifiers.   I discussed the limitations, risks, security and privacy concerns of performing an evaluation and management service by telephone and the availability of in person appointments. I also discussed with the patient that there may be a patient responsible charge related to this service. The patient expressed understanding and agreed to proceed.   History of Present Illness: BP med f/u. Was asked to return for check in 2018 but was lost to f/u. Does not ck BP at home. Gets HA from high BP. Has not been taking meds at home. Shortness of breath for many mo, exertional. Denies CP, COPD, asthma, or smoking. Has gained wt.    Observations/Objective: No conversational dyspnea. Age appropriate judgment and insight Nml affect and mood  Assessment and Plan: Essential hypertension Norvasc 5 mg/d, counseled on diet and exercise. F/u in 2 weeks, EKG, potential labs  Follow Up Instructions: 2 weeks   I discussed the assessment and treatment plan with the patient. The patient was provided an opportunity to ask questions and all were answered. The patient agreed with the plan and demonstrated an understanding of the instructions.   The patient was advised to call back or seek an in-person evaluation if the symptoms worsen or if the condition fails to improve as anticipated.  I provided 11 minutes of non-face-to-face time during this encounter.   [place], DO

## 2018-09-13 ENCOUNTER — Telehealth: Payer: Self-pay | Admitting: Family Medicine

## 2018-09-13 NOTE — Telephone Encounter (Signed)
I wanted an in person visit with her to both evaluate her but also get an EKG and labs. Schedule her in 1.5 weeks please. Ty.

## 2018-09-13 NOTE — Telephone Encounter (Signed)
Not sure what to do with this

## 2018-09-13 NOTE — Telephone Encounter (Signed)
Copied from [place] [id number]. Topic: Appointment Scheduling - Scheduling Inquiry for Clinic >> Sep 07, 2018  9:17 AM Scherrie Gerlach wrote: Reason for CRM: pt went on cruise 3/12 to [place] and her job is requiring her to see a dr in order to return to work and get a note.  She also had to self quarantine for 14 days after returning. Pt is not having any sx at all.  Pt also out of her bp meds, not seen in 2 yrs and wants a refill.  Pt is scheduled this afternoon at 1:15 pm >> Sep 13, 2018 10:14 AM Antonieta Iba C wrote: Pt called back in to follow up on previous message sent. Advised pt that I am showing that her BP medication has been sent in. Pt says that PCP suggested that she also have a EKG, not showing any orders for scheduling. Pt would like further assistance with this.   ALSO, pt would like assistance with receiving a work note.

## 2018-09-14 NOTE — Telephone Encounter (Signed)
Need to know you schedule and what day you will be in the office. Also are we suppose to see in the office at all.

## 2018-09-14 NOTE — Telephone Encounter (Signed)
Called and scheduled appt for in office visit for EKG on 10/03/18 at 3:30

## 2018-09-14 NOTE — Telephone Encounter (Signed)
Mon and Th's from 8-4.

## 2018-10-03 ENCOUNTER — Encounter: Payer: Self-pay | Admitting: Family Medicine

## 2018-10-03 ENCOUNTER — Other Ambulatory Visit: Payer: Self-pay

## 2018-10-03 ENCOUNTER — Ambulatory Visit (INDEPENDENT_AMBULATORY_CARE_PROVIDER_SITE_OTHER): Payer: Commercial Managed Care - PPO | Admitting: Family Medicine

## 2018-10-03 VITALS — BP 140/98 | HR 95 | Temp 98.1°F | Ht 65.0 in | Wt 254.0 lb

## 2018-10-03 DIAGNOSIS — Z9119 Patient's noncompliance with other medical treatment and regimen: Secondary | ICD-10-CM

## 2018-10-03 DIAGNOSIS — R252 Cramp and spasm: Secondary | ICD-10-CM

## 2018-10-03 DIAGNOSIS — R1312 Dysphagia, oropharyngeal phase: Secondary | ICD-10-CM | POA: Diagnosis not present

## 2018-10-03 DIAGNOSIS — I10 Essential (primary) hypertension: Secondary | ICD-10-CM | POA: Diagnosis not present

## 2018-10-03 DIAGNOSIS — Z91199 Patient's noncompliance with other medical treatment and regimen due to unspecified reason: Secondary | ICD-10-CM

## 2018-10-03 DIAGNOSIS — R079 Chest pain, unspecified: Secondary | ICD-10-CM

## 2018-10-03 MED ORDER — AMLODIPINE BESYLATE-VALSARTAN 5-320 MG PO TABS: 1.0000 | ORAL_TABLET | Freq: Every day | ORAL | 2 refills | Status: DC

## 2018-10-03 NOTE — Patient Instructions (Addendum)
Drink 3-4 bottles of water daily.   A spoonful of pickle juice daily can help with cramps also.   Give [place] 2-3 business days to get the results of your labs back.   Keep the diet clean and stay active.  If you do not hear anything about your referral in the next 1-2 weeks, call our office and ask for an update.  Claritin (loratadine), Allegra (fexofenadine), Zyrtec (cetirizine) which is also equivalent to Xyzal (levocetirizine); these are listed in order from weakest to strongest. Generic, and therefore cheaper, options are in the parentheses.   Flonase (fluticasone); nasal spray that is over the counter. 2 sprays each nostril, once daily. Aim towards the same side eye when you spray.  There are available OTC, and the generic versions, which may be cheaper, are in parentheses. Show this to a pharmacist if you have trouble finding any of these items.  Let us know if you need anything.

## 2018-10-03 NOTE — Progress Notes (Signed)
Chief Complaint  Patient presents with  . Shortness of Breath    leg cramps    Subjective Tiffany Peterson is a 48 y.o. female who presents for hypertension follow up. She does not monitor home blood pressures. She is compliant with medications- Norvasc 5 mg/d. Patient has these side effects of medication: none She is not adhering to a healthy diet overall. Current exercise: some walking  Leg cramps Has been having leg cramps that lead to soreness. Drinks mainly soda and lemonade, no water. Mustard will help with this. No routine stretching.  Chest pain: Duration of issue: several months Quality: dull Palliation: rest Provocation: physical exertion Radiation: none Duration of chest pain: several minutes Associated symptoms: sob Cardiac history: HTN, noncompliance,  Family heart history: none Smoker? No  Has been having trouble swallowing for around 6 mo. Feels like it food/liquid gets stuck in upper chest area. Does not regurgitate. No wt loss. No hx of reflux.    Past Medical History:  Diagnosis Date  . Essential hypertension 08/19/2016  . Morbid obesity ([place])    Past Surgical History:  Procedure Laterality Date  . TUBAL LIGATION     Family History  Problem Relation Age of Onset  . Cancer Neg Hx    Claritin (loratadine), Allegra (fexofenadine), Zyrtec (cetirizine) which is also equivalent to Xyzal (levocetirizine); these are listed in order from weakest to strongest. Generic, and therefore cheaper, options are in the parentheses.   Flonase (fluticasone); nasal spray that is over the counter. 2 sprays each nostril, once daily. Aim towards the same side eye when you spray.  There are available OTC, and the generic versions, which may be cheaper, are in parentheses. Show this to a pharmacist if you have trouble finding any of these items.  Review of Systems Cardiovascular: + chest pain Respiratory:  + shortness of breath Const: No fevers Nose: No congestion Ears: No  ear pain Eyes: No vision changes Neuro: +dysphagia GI: no pain Heme: No easy bleeding Throat: No ST MSK: +leg pain  Exam BP (!) 140/98 (BP Location: Left Arm, Patient Position: Sitting, Cuff Size: Large)   Pulse 95   Temp 98.1 F (36.7 C) (Oral)   Ht 5\' 5"  (1.651 m)   Wt 254 lb (115.2 kg)   SpO2 97%   BMI 42.27 kg/m  General:  well developed, well nourished, in no apparent distress Heart: RRR, no bruits, no LE edema Abd: Soft, NT, ND, no masses or organomegaly Mouth: MMM, Mallampati IV Lungs: clear to auscultation, no accessory muscle use Psych: well oriented with normal range of affect and appropriate judgment/insight  Essential hypertension - Plan: amLODipine-valsartan (EXFORGE) 5-320 MG tablet, Comprehensive metabolic panel  Oropharyngeal dysphagia - Plan: Ambulatory referral to Gastroenterology  Leg cramp - Plan: Magnesium  Exertional chest pain - Plan: Ambulatory referral to Cardiology, CBC, EKG 12-Lead  Noncompliance  Morbid obesity ([place])  Orders as above. Add ARB to Norvasc. Monitor BP. Counseled on diet and exercise- listen to body and avoid if chest pain arises. EKG nml. Will have her see cards. Needs to increase water intake for cramps. Pickle juice rec'd also. For swallowing issue, will have her see GI for further eval.  I think a portion of her dyspnea likes with her wt gain.  She has been noncompliant with follow up as I have not see her in many months prior to her E visit and there are many things to catch up on.  F/u in 2 weeks to reck BP. The patient  voiced understanding and agreement to the plan.  [place], DO 10/03/18  4:34 PM

## 2018-10-04 ENCOUNTER — Other Ambulatory Visit (INDEPENDENT_AMBULATORY_CARE_PROVIDER_SITE_OTHER): Payer: Commercial Managed Care - PPO

## 2018-10-04 DIAGNOSIS — D649 Anemia, unspecified: Secondary | ICD-10-CM

## 2018-10-04 LAB — COMPREHENSIVE METABOLIC PANEL
ALT: 13 U/L (ref 0–35)
AST: 14 U/L (ref 0–37)
Albumin: 4 g/dL (ref 3.5–5.2)
Alkaline Phosphatase: 93 U/L (ref 39–117)
BUN: 20 mg/dL (ref 6–23)
CO2: 27 mEq/L (ref 19–32)
Calcium: 8.8 mg/dL (ref 8.4–10.5)
Chloride: 103 mEq/L (ref 96–112)
Creatinine, Ser: 1.02 mg/dL (ref 0.40–1.20)
GFR: 69.37 mL/min (ref >60.00)
Glucose, Bld: 96 mg/dL (ref 70–99)
Potassium: 3.9 mEq/L (ref 3.5–5.1)
Sodium: 138 mEq/L (ref 135–145)
Total Bilirubin: 0.3 mg/dL (ref 0.2–1.2)
Total Protein: 7.9 g/dL (ref 6.0–8.3)

## 2018-10-04 LAB — CBC
HCT: 28.2 % — ABNORMAL LOW (ref 36.0–46.0)
Hemoglobin: 8.7 g/dL — ABNORMAL LOW (ref 12.0–15.0)
MCHC: 30.8 g/dL (ref 30.0–36.0)
MCV: 65.6 fl — ABNORMAL LOW (ref 78.0–100.0)
Platelets: 385 10*3/uL (ref 150.0–400.0)
RBC: 4.31 Mil/uL (ref 3.87–5.11)
RDW: 17.1 % — ABNORMAL HIGH (ref 11.5–15.5)
WBC: 6.3 10*3/uL (ref 4.0–10.5)

## 2018-10-04 LAB — IBC PANEL
Iron: 20 ug/dL — ABNORMAL LOW (ref 42–145)
Saturation Ratios: 3.9 % — ABNORMAL LOW (ref 20.0–50.0)
Transferrin: 366 mg/dL — ABNORMAL HIGH (ref 212.0–360.0)

## 2018-10-04 LAB — MAGNESIUM: Magnesium: 1.9 mg/dL (ref 1.5–2.5)

## 2018-10-05 ENCOUNTER — Telehealth: Payer: Self-pay

## 2018-10-05 LAB — FERRITIN: Ferritin: 6.8 ng/mL — ABNORMAL LOW (ref 10.0–291.0)

## 2018-10-05 NOTE — Telephone Encounter (Signed)
Patient returned call regarding lab results. Advised to start Iron suppliment. Patietn agreed.

## 2018-10-10 ENCOUNTER — Telehealth: Payer: Self-pay | Admitting: Cardiology

## 2018-10-10 NOTE — Telephone Encounter (Signed)
Virtual Visit Pre-Appointment Phone Call  "(Name), I am calling you today to discuss your upcoming appointment. We are currently trying to limit exposure to the virus that causes COVID-19 by seeing patients at home rather than in the office."  1. "What is the BEST phone number to call the day of the visit?" - include this in appointment notes  2. Do you have or have access to (through a family member/friend) a smartphone with video capability that we can use for your visit?" a. If yes - list this number in appt notes as cell (if different from BEST phone #) and list the appointment type as a VIDEO visit in appointment notes b. If no - list the appointment type as a PHONE visit in appointment notes  3. Confirm consent - "In the setting of the current Covid19 crisis, you are scheduled for a (phone or video) visit with your provider on (date) at (time).  Just as we do with many in-office visits, in order for you to participate in this visit, we must obtain consent.  If you'd like, I can send this to your mychart (if signed up) or email for you to review.  Otherwise, I can obtain your verbal consent now.  All virtual visits are billed to your insurance company just like a normal visit would be.  By agreeing to a virtual visit, we'd like you to understand that the technology does not allow for your provider to perform an examination, and thus may limit your provider's ability to fully assess your condition. If your provider identifies any concerns that need to be evaluated in person, we will make arrangements to do so.  Finally, though the technology is pretty good, we cannot assure that it will always work on either your or our end, and in the setting of a video visit, we may have to convert it to a phone-only visit.  In either situation, we cannot ensure that we have a secure connection.  Are you willing to proceed?" STAFF: Did the patient verbally acknowledge consent to telehealth visit? Document  YES/NO here: Yes  4. Advise patient to be prepared - "Two hours prior to your appointment, go ahead and check your blood pressure, pulse, oxygen saturation, and your weight (if you have the equipment to check those) and write them all down. When your visit starts, your provider will ask you for this information. If you have an Apple Watch or Kardia device, please plan to have heart rate information ready on the day of your appointment. Please have a pen and paper handy nearby the day of the visit as well."  5. Give patient instructions for MyChart download to smartphone OR Doximity/Doxy.me as below if video visit (depending on what platform provider is using)  6. Inform patient they will receive a phone call 15 minutes prior to their appointment time (may be from unknown caller ID) so they should be prepared to answer    TELEPHONE CALL NOTE  Tiffany Peterson has been deemed a candidate for a follow-up tele-health visit to limit community exposure during the Covid-19 pandemic. I spoke with the patient via phone to ensure availability of phone/video source, confirm preferred email & phone number, and discuss instructions and expectations.  I reminded Tiffany Peterson to be prepared with any vital sign and/or heart rhythm information that could potentially be obtained via home monitoring, at the time of her visit. I reminded Tiffany Peterson to expect a phone call prior to her visit.  Tiffany Peterson 10/10/2018 4:51 PM   INSTRUCTIONS FOR DOWNLOADING THE MYCHART APP TO SMARTPHONE  - The patient must first make sure to have activated MyChart and know their login information - If Apple, go to [employer] and type in MyChart in the search bar and download the app. If Android, ask patient to go to [employer] and type in [place] in the search bar and download the app. The app is free but as with any other app downloads, their phone may require them to verify saved payment information or Apple/Android  password.  - The patient will need to then log into the app with their MyChart username and password, and select [facility] as their healthcare provider to link the account. When it is time for your visit, go to the MyChart app, find appointments, and click Begin Video Visit. Be sure to Select Allow for your device to access the Microphone and Camera for your visit. You will then be connected, and your provider will be with you shortly.  **If they have any issues connecting, or need assistance please contact MyChart service desk [phone])**  **If using a computer, in order to ensure the best quality for their visit they will need to use either of the following Internet Browsers: [employer], or Google Chrome**  IF USING DOXIMITY or DOXY.ME - The patient will receive a link just prior to their visit by text.     FULL LENGTH CONSENT FOR TELE-HEALTH VISIT   I hereby voluntarily request, consent and authorize [facility] and its employed or contracted physicians, physician assistants, nurse practitioners or other licensed health care professionals (the Practitioner), to provide me with telemedicine health care services (the Services") as deemed necessary by the treating Practitioner. I acknowledge and consent to receive the Services by the Practitioner via telemedicine. I understand that the telemedicine visit will involve communicating with the Practitioner through live audiovisual communication technology and the disclosure of certain medical information by electronic transmission. I acknowledge that I have been given the opportunity to request an in-person assessment or other available alternative prior to the telemedicine visit and am voluntarily participating in the telemedicine visit.  I understand that I have the right to withhold or withdraw my consent to the use of telemedicine in the course of my care at any time, without affecting my right to future care or treatment,  and that the Practitioner or I may terminate the telemedicine visit at any time. I understand that I have the right to inspect all information obtained and/or recorded in the course of the telemedicine visit and may receive copies of available information for a reasonable fee.  I understand that some of the potential risks of receiving the Services via telemedicine include:   Delay or interruption in medical evaluation due to technological equipment failure or disruption;  Information transmitted may not be sufficient (e.g. poor resolution of images) to allow for appropriate medical decision making by the Practitioner; and/or   In rare instances, security protocols could fail, causing a breach of personal health information.  Furthermore, I acknowledge that it is my responsibility to provide information about my medical history, conditions and care that is complete and accurate to the best of my ability. I acknowledge that Practitioner's advice, recommendations, and/or decision may be based on factors not within their control, such as incomplete or inaccurate data provided by me or distortions of diagnostic images or specimens that may result from electronic transmissions. I understand that the  practice of medicine is not an Chief Strategy Officer and that Practitioner makes no warranties or guarantees regarding treatment outcomes. I acknowledge that I will receive a copy of this consent concurrently upon execution via email to the email address I last provided but may also request a printed copy by calling the office of [facility].    I understand that my insurance will be billed for this visit.   I have read or had this consent read to me.  I understand the contents of this consent, which adequately explains the benefits and risks of the Services being provided via telemedicine.   I have been provided ample opportunity to ask questions regarding this consent and the Services and have had my questions  answered to my satisfaction.  I give my informed consent for the services to be provided through the use of telemedicine in my medical care  By participating in this telemedicine visit I agree to the above.

## 2018-10-11 DIAGNOSIS — D649 Anemia, unspecified: Secondary | ICD-10-CM | POA: Insufficient documentation

## 2018-10-11 NOTE — Progress Notes (Signed)
Virtual Visit via Video Note   This visit type was conducted due to national recommendations for restrictions regarding the COVID-19 Pandemic (e.g. social distancing) in an effort to limit this patient's exposure and mitigate transmission in our community.  Due to her co-morbid illnesses, this patient is at least at moderate risk for complications without adequate follow up.  This format is felt to be most appropriate for this patient at this time.  All issues noted in this document were discussed and addressed.  A limited physical exam was performed with this format.  Please refer to the patient's chart for her consent to telehealth for [facility].   Evaluation Performed:  Follow-up visit  Date:  10/12/2018   ID:  Tiffany Peterson, DOB 11-04-1970, MRN [id number]  Patient Location: Home Provider Location: Home  PCP:  Shelda Pal, DO  Cardiologist:  No primary care provider on file. Dr Electrophysiologist:  None   Chief Complaint: Anemia with shortness of breath edema orthopnea and chest pain  History of Present Illness:    Tiffany Peterson is a 48 y.o. female with anemia and  hypertension referred by Dr Nani Ravens for chest pain.  For several weeks she is just felt progressively worse with weakness fatigue exertional shortness of breath edema orthopnea and actually a couple of episodes of nocturnal dyspnea.  She has a background history of hypertension and has severe symptomatic anemia iron deficient.  She has had heavy menses.  She says she gets chest pain and describes it is brief momentary fleeting and sharp in nature.  She also has had some pain on swallowing and is pending a GI evaluation.  She has no known history of congenital rheumatic heart heart disease she still works full-time is not having exertional angina palpitation or syncope.  She relates that in the past when she took what sounds like a thiazide diuretic she had severe hypokalemia and there are none in epic  that are abnormal.  She has had no previous cardiology testing performed other than EKG in the past.  The patient does not have symptoms concerning for COVID-19 infection (fever, chills, cough, or new shortness of breath).    Past Medical History:  Diagnosis Date   Essential hypertension 08/19/2016   Morbid obesity ([place])    Past Surgical History:  Procedure Laterality Date   TUBAL LIGATION       Current Meds  Medication Sig   amLODipine-valsartan (EXFORGE) 5-320 MG tablet Take 1 tablet by mouth daily.   ferrous sulfate 325 (65 FE) MG EC tablet Take 325 mg by mouth 3 (three) times daily with meals.   vitamin B-12 (CYANOCOBALAMIN) 1000 MCG tablet Take 1,000 mcg by mouth daily.     Allergies:   Patient has no known allergies.   Social History   Tobacco Use   Smoking status: Never Smoker   Smokeless tobacco: Never Used  Substance Use Topics   Alcohol use: No   Drug use: Not Currently     Family Hx: The patient's family history includes Stroke in her maternal aunt. There is no history of Cancer or Heart attack.  ROS:   Please see the history of present illness.    Review of Systems  Constitution: Positive for malaise/fatigue.  HENT: Negative.   Eyes: Negative.   Cardiovascular: Positive for chest pain, dyspnea on exertion and leg swelling.  Respiratory: Positive for shortness of breath and sleep disturbances due to breathing.   Endocrine: Negative.   Hematologic/Lymphatic: Negative.   Skin:  Negative.   Musculoskeletal: Positive for muscle weakness.  Gastrointestinal: Positive for dysphagia.  Genitourinary: Positive for menorrhagia.  Neurological: Negative.   Psychiatric/Behavioral: Negative.   Allergic/Immunologic: Negative.    All other systems reviewed and are negative.   Prior CV studies:   The following studies were reviewed today:    Labs/Other Tests and Data Reviewed:    EKG:  An ECG dated 10/03/18 was personally reviewed today and demonstrated:   sinus rhythm 67 BPM LAE otherwise normal  Recent Labs:   Ref Range & Units 7d ago 25yr ago  Ferritin 10.0 - 291.0 ng/mL 6.8Low   6.0Low      Ref Range & Units 8d ago 20yr ago  Iron 42 - 145 ug/dL 20Low   14Low    Transferrin 212.0 - 360.0 mg/dL 366.0High   346.0   Saturation Ratios 20.0 - 50.0 % 3.9Low   2.9Low       10/03/2018: ALT 13; BUN 20; Creatinine, Ser 1.02; Hemoglobin 8.7 Repeated and verified X2.; Magnesium 1.9; Platelets 385.0; Potassium 3.9; Sodium 138   Recent Lipid Panel No results found for: CHOL, TRIG, HDL, CHOLHDL, LDLCALC, LDLDIRECT  Wt Readings from Last 3 Encounters:  10/12/18 259 lb (117.5 kg)  10/03/18 254 lb (115.2 kg)  09/18/16 229 lb 9.6 oz (104.1 kg)     Objective:    Vital Signs:  BP (!) 158/91 (BP Location: Right Arm, Patient Position: Sitting)    Pulse 65    Ht 5\' 5"  (1.651 m)    Wt 259 lb (117.5 kg)    BMI 43.10 kg/m    VITAL SIGNS:  reviewed GEN:  no acute distress EYES:  sclerae anicteric, EOMI - Extraocular Movements Intact RESPIRATORY:  normal respiratory effort, symmetric expansion CARDIOVASCULAR:  no peripheral edema SKIN:  no rash, lesions or ulcers. MUSCULOSKELETAL:  no obvious deformities. NEURO:  alert and oriented x 3, no obvious focal deficit PSYCH:  normal affect  ASSESSMENT & PLAN:    1. Clinically she presents with shortness of breath in the setting of longstanding hypertension severe symptomatic anemia and appears to have congestive heart failure.  I will start her on low-dose loop diuretic as well as spironolactone with a background history of hypokalemia I am not can give her potassium supplements because of her dysphasia.  I asked my office staff to have her do a BMP proBNP in a week she needs an echocardiogram to assess if she has underlying structural heart disease and cardiomyopathy and I will plan to see her in the office in 6 weeks once her anemia is improved to see if she really needs to remain on diuretics and whether we  need to consider an ischemia evaluation.  The differential diagnosis here includes edema related to her diuretic and even potentially sleep apnea but her symptoms are so characteristic that I think it is obvious she has heart failure and I will initiate therapy. 2. Hypertension not at target continue current treatment with ARB calcium channel blocker and I think with resolution of edema and diuretic her blood pressure will achieve target 3. Anemia iron deficient she is on iron supplements may require IV if she does not quickly respond as she is symptomatic 4. Chest pain quite atypical nonanginal normal EKG and I would not pursue an ischemia evaluation at this time as a noninvasive testing can be very difficult to interpret and may be falsely abnormal in the setting of severe anemia.  I would like to see her back in the  office in 6 weeks and make a decision if she needs further evaluation at that time.  COVID-19 Education: The signs and symptoms of COVID-19 were discussed with the patient and how to seek care for testing (follow up with PCP or arrange E-visit).  The importance of social distancing was discussed today.  Time:   Today, I have spent 30 minutes with the patient with telehealth technology discussing the above problems.     Medication Adjustments/Labs and Tests Ordered: Current medicines are reviewed at length with the patient today.  Concerns regarding medicines are outlined above.   Tests Ordered: No orders of the defined types were placed in this encounter.   Medication Changes: No orders of the defined types were placed in this encounter.   Disposition:  Follow up in 6 week(s)  Signed, Shirlee More, MD  10/12/2018 4:59 PM    [facility]

## 2018-10-12 ENCOUNTER — Other Ambulatory Visit: Payer: Self-pay

## 2018-10-12 ENCOUNTER — Telehealth (INDEPENDENT_AMBULATORY_CARE_PROVIDER_SITE_OTHER): Payer: Commercial Managed Care - PPO | Admitting: Cardiology

## 2018-10-12 ENCOUNTER — Encounter: Payer: Self-pay | Admitting: Cardiology

## 2018-10-12 VITALS — BP 158/91 | HR 65 | Ht 65.0 in | Wt 259.0 lb

## 2018-10-12 DIAGNOSIS — R079 Chest pain, unspecified: Secondary | ICD-10-CM

## 2018-10-12 DIAGNOSIS — R0602 Shortness of breath: Secondary | ICD-10-CM

## 2018-10-12 DIAGNOSIS — I509 Heart failure, unspecified: Secondary | ICD-10-CM

## 2018-10-12 DIAGNOSIS — Z7189 Other specified counseling: Secondary | ICD-10-CM

## 2018-10-12 DIAGNOSIS — D649 Anemia, unspecified: Secondary | ICD-10-CM

## 2018-10-12 DIAGNOSIS — I10 Essential (primary) hypertension: Secondary | ICD-10-CM

## 2018-10-12 NOTE — Patient Instructions (Addendum)
Medication Instructions:  Your physician has recommended you make the following change in your medication:   START furosemide (lasix) 20 mg: Take 1 tablet daily START spironolactone (aldactone) 25 mg: Take 1 tablet daily   If you need a refill on your cardiac medications before your next appointment, please call your pharmacy.   Lab work: Your physician recommends that you return for lab work within 1 week at the [facility] when you go for your echocardiogram on 10/19/2018: BMP, ProBNP. No appointment needed, no need to fast beforehand. Go to [address] for lab work in the [facility].   If you have labs (blood work) drawn today and your tests are completely normal, you will receive your results only by: Marland Kitchen MyChart Message (if you have MyChart) OR . A paper copy in the mail If you have any lab test that is abnormal or we need to change your treatment, we will call you to review the results.  Testing/Procedures: Your physician has requested that you have an echocardiogram. Echocardiography is a painless test that uses sound waves to create images of your heart. It provides your doctor with information about the size and shape of your heart and how well your heart's chambers and valves are working. This procedure takes approximately one hour. There are no restrictions for this procedure. This will be done at the [facility] on Wednesday, 10/19/2018, at 9:15 am. Please go to [place] on the 1st floor for testing.   Follow-Up: At [facility], you and your health needs are our priority.  As part of our continuing mission to provide you with exceptional heart care, we have created designated Provider Care Teams.  These Care Teams include your primary Cardiologist (physician) and Advanced Practice Providers (APPs -  Physician Assistants and Nurse Practitioners) who all work together to provide you with the care you need, when you need it. You will need a follow  up virtual appointment in 6 weeks: Thursday, 11/24/2018, at 4:00 pm.   Any Other Special Instructions Will Be Listed Below (If Applicable).  Please check your blood pressure and weigh yourself daily at the same time each day. Record these readings and bring to your next follow up appointment for Dr. Bettina Gavia to review.        Heart Failure  Weigh yourself every morning when you first wake up and record on a calender or note pad, bring this to your office visits. Using a pill tender can help with taking your medications consistently.  Limit your fluid intake to 2 liters daily  Limit your sodium intake to less than 2-3 grams daily. Ask if you need dietary teaching.  If you gain more than 3 pounds (from your dry weight ), double your dose of diuretic for the day.  If you gain more than 5 pounds (from your dry weight), double your dose of lasix and call your heart failure doctor.  Please do not smoke tobacco since it is very bad for your heart.  Please do not drink alcohol since it can worsen your heart failure.Also avoid OTC nonsteroidal drugs, such as advil, aleve and motrin.  Try to exercise for at least 30 minutes every day because this will help your heart be more efficient. You may be eligible for supervised cardiac rehab, ask your physician.     Echocardiogram An echocardiogram is a procedure that uses painless sound waves (ultrasound) to produce an image of the heart. Images from an echocardiogram can  provide important information about:  Signs of coronary artery disease (CAD).  Aneurysm detection. An aneurysm is a weak or damaged part of an artery wall that bulges out from the normal force of blood pumping through the body.  Heart size and shape. Changes in the size or shape of the heart can be associated with certain conditions, including heart failure, aneurysm, and CAD.  Heart muscle function.  Heart valve function.  Signs of a past heart attack.  Fluid buildup  around the heart.  Thickening of the heart muscle.  A tumor or infectious growth around the heart valves. Tell a health care provider about:  Any allergies you have.  All medicines you are taking, including vitamins, herbs, eye drops, creams, and over-the-counter medicines.  Any blood disorders you have.  Any surgeries you have had.  Any medical conditions you have.  Whether you are pregnant or may be pregnant. What are the risks? Generally, this is a safe procedure. However, problems may occur, including:  Allergic reaction to dye (contrast) that may be used during the procedure. What happens before the procedure? No specific preparation is needed. You may eat and drink normally. What happens during the procedure?   An IV tube may be inserted into one of your veins.  You may receive contrast through this tube. A contrast is an injection that improves the quality of the pictures from your heart.  A gel will be applied to your chest.  A wand-like tool (transducer) will be moved over your chest. The gel will help to transmit the sound waves from the transducer.  The sound waves will harmlessly bounce off of your heart to allow the heart images to be captured in real-time motion. The images will be recorded on a computer. The procedure may vary among health care providers and hospitals. What happens after the procedure?  You may return to your normal, everyday life, including diet, activities, and medicines, unless your health care provider tells you not to do that. Summary  An echocardiogram is a procedure that uses painless sound waves (ultrasound) to produce an image of the heart.  Images from an echocardiogram can provide important information about the size and shape of your heart, heart muscle function, heart valve function, and fluid buildup around your heart.  You do not need to do anything to prepare before this procedure. You may eat and drink normally.  After  the echocardiogram is completed, you may return to your normal, everyday life, unless your health care provider tells you not to do that. This information is not intended to replace advice given to you by your health care provider. Make sure you discuss any questions you have with your health care provider. Document Released: 05/29/2000 Document Revised: 07/04/2016 Document Reviewed: 07/04/2016 Elsevier Interactive Patient Education  2019 Elsevier Inc.     Furosemide tablets What is this medicine? FUROSEMIDE (fyoor [place] se mide) is a diuretic. It helps you make more urine and to lose salt and excess water from your body. This medicine is used to treat high blood pressure, and edema or swelling from heart, kidney, or liver disease. This medicine may be used for other purposes; ask your health care provider or pharmacist if you have questions. COMMON BRAND NAME(S): Active-Medicated Specimen Kit, Delone, Diuscreen, Lasix, RX Specimen Collection Kit, Specimen Collection Kit, URINX Medicated Specimen Collection What should I tell my health care provider before I take this medicine? They need to know if you have any of these conditions: -abnormal  blood electrolytes -diarrhea or vomiting -gout -heart disease -kidney disease, small amounts of urine, or difficulty passing urine -liver disease -thyroid disease -an unusual or allergic reaction to furosemide, sulfa drugs, other medicines, foods, dyes, or preservatives -pregnant or trying to get pregnant -breast-feeding How should I use this medicine? Take this medicine by mouth with a glass of water. Follow the directions on the prescription label. You may take this medicine with or without food. If it upsets your stomach, take it with food or milk. Do not take your medicine more often than directed. Remember that you will need to pass more urine after taking this medicine. Do not take your medicine at a time of day that will cause you problems. Do not  take at bedtime. Talk to your pediatrician regarding the use of this medicine in children. While this drug may be prescribed for selected conditions, precautions do apply. Overdosage: If you think you have taken too much of this medicine contact a poison control center or emergency room at once. NOTE: This medicine is only for you. Do not share this medicine with others. What if I miss a dose? If you miss a dose, take it as soon as you can. If it is almost time for your next dose, take only that dose. Do not take double or extra doses. What may interact with this medicine? -aspirin and aspirin-like medicines -certain antibiotics -chloral hydrate -cisplatin -cyclosporine -digoxin -diuretics -laxatives -lithium -medicines for blood pressure -medicines that relax muscles for surgery -methotrexate -NSAIDs, medicines for pain and inflammation like ibuprofen, naproxen, or indomethacin -phenytoin -steroid medicines like prednisone or cortisone -sucralfate -thyroid hormones This list may not describe all possible interactions. Give your health care provider a list of all the medicines, herbs, non-prescription drugs, or dietary supplements you use. Also tell them if you smoke, drink alcohol, or use illegal drugs. Some items may interact with your medicine. What should I watch for while using this medicine? Visit your doctor or health care professional for regular checks on your progress. Check your blood pressure regularly. Ask your doctor or health care professional what your blood pressure should be, and when you should contact him or her. If you are a diabetic, check your blood sugar as directed. You may need to be on a special diet while taking this medicine. Check with your doctor. Also, ask how many glasses of fluid you need to drink a day. You must not get dehydrated. You may get drowsy or dizzy. Do not drive, use machinery, or do anything that needs mental alertness until you know how this  drug affects you. Do not stand or sit up quickly, especially if you are an older patient. This reduces the risk of dizzy or fainting spells. Alcohol can make you more drowsy and dizzy. Avoid alcoholic drinks. This medicine can make you more sensitive to the sun. Keep out of the sun. If you cannot avoid being in the sun, wear protective clothing and use sunscreen. Do not use sun lamps or tanning beds/booths. What side effects may I notice from receiving this medicine? Side effects that you should report to your doctor or health care professional as soon as possible: -blood in urine or stools -dry mouth -fever or chills -hearing loss or ringing in the ears -irregular heartbeat -muscle pain or weakness, cramps -skin rash -stomach upset, pain, or nausea -tingling or numbness in the hands or feet -unusually weak or tired -vomiting or diarrhea -yellowing of the eyes or skin Side effects that  usually do not require medical attention (report to your doctor or health care professional if they continue or are bothersome): -headache -loss of appetite -unusual bleeding or bruising This list may not describe all possible side effects. Call your doctor for medical advice about side effects. You may report side effects to FDA at 1-800-FDA-1088. Where should I keep my medicine? Keep out of the reach of children. Store at room temperature between 15 and 30 degrees C (59 and 86 degrees F). Protect from light. Throw away any unused medicine after the expiration date. NOTE: This sheet is a summary. It may not cover all possible information. If you have questions about this medicine, talk to your doctor, pharmacist, or health care provider.  2019 Elsevier/Gold Standard (2014-08-22 13:49:50)    Spironolactone tablets What is this medicine? SPIRONOLACTONE ([place]er [place] LAK tone) is a diuretic. It helps you make more urine and to lose excess water from your body. This medicine is used to treat high blood  pressure, and edema or swelling from heart, kidney, or liver disease. It is also used to treat patients who make too much aldosterone or have low potassium. This medicine may be used for other purposes; ask your health care provider or pharmacist if you have questions. COMMON BRAND NAME(S): Aldactone What should I tell my health care provider before I take this medicine? They need to know if you have any of these conditions: -high blood level of potassium -kidney disease or trouble making urine -liver disease -an unusual or allergic reaction to spironolactone, other medicines, foods, dyes, or preservatives -pregnant or trying to get pregnant -breast-feeding How should I use this medicine? Take this medicine by mouth with a drink of water. Follow the directions on your prescription label. You can take it with or without food. If it upsets your stomach, take it with food. Do not take your medicine more often than directed. Remember that you will need to pass more urine after taking this medicine. Do not take your doses at a time of day that will cause you problems. Do not take at bedtime. Talk to your pediatrician regarding the use of this medicine in children. While this drug may be prescribed for selected conditions, precautions do apply. Overdosage: If you think you have taken too much of this medicine contact a poison control center or emergency room at once. NOTE: This medicine is only for you. Do not share this medicine with others. What if I miss a dose? If you miss a dose, take it as soon as you can. If it is almost time for your next dose, take only that dose. Do not take double or extra doses. What may interact with this medicine? Do not take this medicine with any of the following medications: -cidofovir -eplerenone -tranylcypromine This medicine may also interact with the following medications: -aspirin -certain medicines for blood pressure or heart disease like benazepril,  lisinopril, losartan, valsartan -certain medicines that treat or prevent blood clots like heparin and enoxaparin -cholestyramine -cyclosporine -digoxin -lithium -medicines that relax muscles for surgery -NSAIDs, medicines for pain and inflammation, like ibuprofen or naproxen -other diuretics -potassium supplements -steroid medicines like prednisone or cortisone -trimethoprim This list may not describe all possible interactions. Give your health care provider a list of all the medicines, herbs, non-prescription drugs, or dietary supplements you use. Also tell them if you smoke, drink alcohol, or use illegal drugs. Some items may interact with your medicine. What should I watch for while using this medicine?  Visit your doctor or health care professional for regular checks on your progress. Check your blood pressure as directed. Ask your doctor what your blood pressure should be, and when you should contact them. You may need to be on a special diet while taking this medicine. Ask your doctor. Also, ask how many glasses of fluid you need to drink a day. You must not get dehydrated. This medicine may make you feel confused, dizzy or lightheaded. Drinking alcohol and taking some medicines can make this worse. Do not drive, use machinery, or do anything that needs mental alertness until you know how this medicine affects you. Do not sit or stand up quickly. What side effects may I notice from receiving this medicine? Side effects that you should report to your doctor or health care professional as soon as possible: -allergic reactions such as skin rash or itching, hives, swelling of the lips, mouth, tongue, or throat -black or tarry stools -fast, irregular heartbeat -fever -muscle pain, cramps -numbness, tingling in hands or feet -trouble breathing -trouble passing urine -unusual bleeding -unusually weak or tired Side effects that usually do not require medical attention (report to your doctor  or health care professional if they continue or are bothersome): -change in voice or hair growth -confusion -dizzy, drowsy -dry mouth, increased thirst -enlarged or tender breasts -headache -irregular menstrual periods -sexual difficulty, unable to have an erection -stomach upset This list may not describe all possible side effects. Call your doctor for medical advice about side effects. You may report side effects to FDA at 1-800-FDA-1088. Where should I keep my medicine? Keep out of the reach of children. Store below 25 degrees C (77 degrees F). Throw away any unused medicine after the expiration date. NOTE: This sheet is a summary. It may not cover all possible information. If you have questions about this medicine, talk to your doctor, pharmacist, or health care provider.  2019 Elsevier/Gold Standard (2016-10-16 09:42:28)

## 2018-10-13 MED ORDER — FUROSEMIDE 20 MG PO TABS: 20.0000 mg | ORAL_TABLET | Freq: Every day | ORAL | 1 refills | Status: DC

## 2018-10-13 MED ORDER — SPIRONOLACTONE 25 MG PO TABS: 25.0000 mg | ORAL_TABLET | Freq: Every day | ORAL | 1 refills | Status: DC

## 2018-10-13 NOTE — Addendum Note (Signed)
Addended by: Austin Miles on: 10/13/2018 08:35 AM   Modules accepted: Orders

## 2018-10-18 ENCOUNTER — Other Ambulatory Visit: Payer: Self-pay

## 2018-10-18 ENCOUNTER — Ambulatory Visit (INDEPENDENT_AMBULATORY_CARE_PROVIDER_SITE_OTHER): Payer: Commercial Managed Care - PPO | Admitting: Family Medicine

## 2018-10-18 ENCOUNTER — Encounter: Payer: Self-pay | Admitting: Family Medicine

## 2018-10-18 DIAGNOSIS — I10 Essential (primary) hypertension: Secondary | ICD-10-CM | POA: Diagnosis not present

## 2018-10-18 DIAGNOSIS — B353 Tinea pedis: Secondary | ICD-10-CM

## 2018-10-18 MED ORDER — KETOCONAZOLE 2 % EX CREA: 1.0000 "application " | TOPICAL_CREAM | Freq: Every day | CUTANEOUS | 0 refills | Status: AC

## 2018-10-18 MED ORDER — METOPROLOL SUCCINATE ER 50 MG PO TB24: 50.0000 mg | ORAL_TABLET | Freq: Every day | ORAL | 3 refills | Status: DC

## 2018-10-18 NOTE — Progress Notes (Signed)
Chief Complaint  Patient presents with  . Follow-up    Subjective Tiffany Peterson is a 48 y.o. female who presents for hypertension follow up. Due to COVID-19 pandemic, we are interacting via web portal for an electronic face-to-face visit. I verified patient's ID using 2 identifiers. Patient agreed to proceed with visit via this method. Patient is at home, I am at home. Patient and I are present for visit.   She does monitor home blood pressures. Blood pressures ranging from 160-170's/90-100's on average. She is compliant with medications- Exforge 5-320 mg/d, spironolactone 25 mg/d. Patient has these side effects of medication: none She is adhering to a healthy diet overall. Current exercise: some walking  Has "blisters" and itching on both feet in b/t toes. No new topicals, fevers, pain, sick contacts. TAO unhelpful.    Past Medical History:  Diagnosis Date  . Essential hypertension 08/19/2016  . Morbid obesity ([place])     Review of Systems Cardiovascular: no chest pain Respiratory:  no shortness of breath  Exam No conversational dyspnea Age appropriate judgment and insight Nml affect and mood  Essential hypertension - Plan: metoprolol succinate (TOPROL-XL) 50 MG 24 hr tablet  Tinea pedis of both feet - Plan: ketoconazole (NIZORAL) 2 % cream  1- uncontrolled. Healthy diet handout mailed to her. Counseled on diet and exercise. Cont Exforge. Will consider chlorthalidone w K if no improvement. 2- Daily application of above for 6 weeks. Keep feet as dry as possible. F/u in 2 weeks to reck BP. The patient voiced understanding and agreement to the plan.  Burke, DO 10/18/18  3:46 PM

## 2018-10-18 NOTE — Patient Instructions (Signed)
Continue checking BP at home.  We are adding a new medicine.   Healthy Eating Plan Many factors influence your heart health, including eating and exercise habits. Heart (coronary) risk increases with abnormal blood fat (lipid) levels. Heart-healthy meal planning includes limiting unhealthy fats, increasing healthy fats, and making other small dietary changes. This includes maintaining a healthy body weight to help keep lipid levels within a normal range.  WHAT IS MY PLAN?  Your health care provider recommends that you:  Drink a glass of water before meals to help with satiety.  Eat slowly.  An alternative to the water is to add Metamucil. This will help with satiety as well. It does contain calories, unlike water.  WHAT TYPES OF FAT SHOULD I CHOOSE?  Choose healthy fats more often. Choose monounsaturated and polyunsaturated fats, such as olive oil and canola oil, flaxseeds, walnuts, almonds, and seeds.  Eat more omega-3 fats. Good choices include salmon, mackerel, sardines, tuna, flaxseed oil, and ground flaxseeds. Aim to eat fish at least two times each week.  Avoid foods with partially hydrogenated oils in them. These contain trans fats. Examples of foods that contain trans fats are stick margarine, some tub margarines, cookies, crackers, and other baked goods. If you are going to avoid a fat, this is the one to avoid!  WHAT GENERAL GUIDELINES DO I NEED TO FOLLOW?  Check food labels carefully to identify foods with trans fats. Avoid these types of options when possible.  Fill one half of your plate with vegetables and green salads. Eat 4-5 servings of vegetables per day. A serving of vegetables equals 1 cup of raw leafy vegetables,  cup of raw or cooked cut-up vegetables, or  cup of vegetable juice.  Fill one fourth of your plate with whole grains. Look for the word "whole" as the first word in the ingredient list.  Fill one fourth of your plate with lean protein foods.  Eat 4-5  servings of fruit per day. A serving of fruit equals one medium whole fruit,  cup of dried fruit,  cup of fresh, frozen, or canned fruit. Try to avoid fruits in cups/syrups as the sugar content can be high.  Eat more foods that contain soluble fiber. Examples of foods that contain this type of fiber are apples, broccoli, carrots, beans, peas, and barley. Aim to get 20-30 g of fiber per day.  Eat more home-cooked food and less restaurant, buffet, and fast food.  Limit or avoid alcohol.  Limit foods that are high in starch and sugar.  Avoid fried foods when able.  Cook foods by using methods other than frying. Baking, boiling, grilling, and broiling are all great options. Other fat-reducing suggestions include: ? Removing the skin from poultry. ? Removing all visible fats from meats. ? Skimming the fat off of stews, soups, and gravies before serving them. ? Steaming vegetables in water or broth.  Lose weight if you are overweight. Losing just 5-10% of your initial body weight can help your overall health and prevent diseases such as diabetes and heart disease.  Increase your consumption of nuts, legumes, and seeds to 4-5 servings per week. One serving of dried beans or legumes equals  cup after being cooked, one serving of nuts equals 1 ounces, and one serving of seeds equals  ounce or 1 tablespoon.  WHAT ARE GOOD FOODS CAN I EAT? Grains Grainy breads (try to find bread that is 3 g of fiber per slice or greater), oatmeal, light popcorn. Whole-grain cereals.  Rice and pasta, including brown rice and those that are made with whole wheat. Edamame pasta is a great alternative to grain pasta. It has a higher protein content. Try to avoid significant consumption of white bread, sugary cereals, or pastries/baked goods.  Vegetables All vegetables. Cooked white potatoes do not count as vegetables.  Fruits All fruits, but limit pineapple and bananas as these fruits have a higher sugar  content.  Meats and Other Protein Sources Lean, well-trimmed beef, veal, pork, and lamb. Chicken and [place] without skin. All fish and shellfish. Wild duck, rabbit, pheasant, and venison. Egg whites or low-cholesterol egg substitutes. Dried beans, peas, lentils, and tofu.Seeds and most nuts.  Dairy Low-fat or nonfat cheeses, including ricotta, string, and mozzarella. Skim or 1% milk that is liquid, powdered, or evaporated. Buttermilk that is made with low-fat milk. Nonfat or low-fat yogurt. Soy/Almond milk are good alternatives if you cannot handle dairy.  Beverages Water is the best for you. Sports drinks with less sugar are more desirable unless you are a highly active athlete.  Sweets and Desserts Sherbets and fruit ices. Honey, jam, marmalade, jelly, and syrups. Dark chocolate.  Eat all sweets and desserts in moderation.  Fats and Oils Nonhydrogenated (trans-free) margarines. Vegetable oils, including soybean, sesame, sunflower, olive, peanut, safflower, corn, canola, and cottonseed. Salad dressings or mayonnaise that are made with a vegetable oil. Limit added fats and oils that you use for cooking, baking, salads, and as spreads.  Other Cocoa powder. Coffee and tea. Most condiments.  The items listed above may not be a complete list of recommended foods or beverages. Contact your dietitian for more options.

## 2018-10-19 ENCOUNTER — Other Ambulatory Visit: Payer: Self-pay

## 2018-10-19 ENCOUNTER — Ambulatory Visit (HOSPITAL_BASED_OUTPATIENT_CLINIC_OR_DEPARTMENT_OTHER)
Admission: RE | Admit: 2018-10-19 | Discharge: 2018-10-19 | Disposition: A | Discharge status: Home / Self Care | Payer: Commercial Managed Care - PPO | Source: Ambulatory Visit | Attending: Cardiology | Admitting: Cardiology

## 2018-10-19 DIAGNOSIS — R0602 Shortness of breath: Secondary | ICD-10-CM | POA: Diagnosis present

## 2018-10-19 DIAGNOSIS — R079 Chest pain, unspecified: Secondary | ICD-10-CM | POA: Insufficient documentation

## 2018-10-19 DIAGNOSIS — I509 Heart failure, unspecified: Secondary | ICD-10-CM | POA: Insufficient documentation

## 2018-10-19 DIAGNOSIS — I10 Essential (primary) hypertension: Secondary | ICD-10-CM | POA: Insufficient documentation

## 2018-10-19 NOTE — Progress Notes (Signed)
  Echocardiogram 2D Echocardiogram has been performed.  Tiffany Peterson 10/19/2018, 10:02 AM

## 2018-10-20 ENCOUNTER — Telehealth: Payer: Self-pay

## 2018-10-20 NOTE — Telephone Encounter (Signed)
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

## 2018-10-20 NOTE — Telephone Encounter (Signed)
Patient advised of echocardiogram results per Dr Bettina Gavia.

## 2018-10-20 NOTE — Telephone Encounter (Signed)
Left message for patient to return call regarding echocardiogram results  

## 2018-10-25 ENCOUNTER — Other Ambulatory Visit: Payer: Self-pay

## 2018-10-25 ENCOUNTER — Telehealth (INDEPENDENT_AMBULATORY_CARE_PROVIDER_SITE_OTHER): Payer: Commercial Managed Care - PPO | Admitting: Gastroenterology

## 2018-10-25 ENCOUNTER — Encounter: Payer: Self-pay | Admitting: Gastroenterology

## 2018-10-25 VITALS — Ht 65.0 in | Wt 253.0 lb

## 2018-10-25 DIAGNOSIS — N921 Excessive and frequent menstruation with irregular cycle: Secondary | ICD-10-CM

## 2018-10-25 DIAGNOSIS — D509 Iron deficiency anemia, unspecified: Secondary | ICD-10-CM

## 2018-10-25 DIAGNOSIS — I10 Essential (primary) hypertension: Secondary | ICD-10-CM | POA: Diagnosis not present

## 2018-10-25 DIAGNOSIS — R0602 Shortness of breath: Secondary | ICD-10-CM

## 2018-10-25 MED ORDER — NA SULFATE-K SULFATE-MG SULF 17.5-3.13-1.6 GM/177ML PO SOLN: 1.0000 | Freq: Once | ORAL | 0 refills | Status: AC

## 2018-10-25 NOTE — Patient Instructions (Signed)
To help prevent the possible spread of infection to our patients, communities, and staff; we will be implementing the following measures:  As of now we are not allowing any visitors/family members to accompany you to any upcoming appointments with [facility]. If you have any concerns about this please contact our office to discuss prior to the appointment.   You have been scheduled for you lab appointment for 10-28-2018  You have been scheduled for an endoscopy and colonoscopy. Please follow the written instructions given to you at your visit today. Please pick up your prep supplies at the pharmacy within the next 1-3 days. If you use inhalers (even only as needed), please bring them with you on the day of your procedure. Your physician has requested that you go to www.startemmi.com and enter the access code given to you at your visit today. This web site gives a general overview about your procedure. However, you should still follow specific instructions given to you by our office regarding your preparation for the procedure.  We have sent the following medications to your pharmacy Suprep  Any questions please call us at [phone]

## 2018-10-25 NOTE — Progress Notes (Signed)
Chief Complaint: Iron deficiency anemia   Referring Provider: Shelda Pal, DO      HPI:    Due to current restrictions/limitations of in-office visits due to the COVID-19 pandemic, this scheduled clinical appointment was converted to a telehealth virtual consultation using D[employer].  -Time of medical discussion: 25 minutes -The patient did consent to this virtual visit and is aware of possible charges through their insurance for this visit.  -Names of all parties present: Tiffany Peterson (patient), Gerrit Heck, DO, [facility] (physician) -Patient location: Home -Physician location: Office  Tiffany Peterson is a 48 y.o. female referred to the Gastroenterology Clinic for evaluation of iron deficiency anemia.  Reports that she has a history of iron deficiency dating back a couple of years.  Was prescribed oral iron therapy in 2018, which she took short-term, but was told she could stop after her iron had improved (cannot find any objective evidence of this in EMR).  Does report a history of heavy menses, lasting 11 to 18 days every other month, which is been present for the last 2 years or so.  Unsure if that predates her previous diagnosis of IDA.  Otherwise, no hematochezia or melena.  Eats plenty of iron rich foods.  No known personal or family history of Celiac Disease, IBD, GI malignancy.  No previous EGD or colonoscopy.  Was started on ferrous sulfate 325 mg TID, which she has taken for last 2 to 3 weeks.  Tolerating medications without issue, but unclear if she is truly taking as TID.  No previous blood transfusion or IV iron infusion.  Recent labs notable for ferritin 6.8 (was 6 2 years ago), iron 20, TIBC 366, iron saturation 3.9%, H/H 8.7/28.2 with MCV/RDW 65.6/17 (was 10.3/33.5 in 2018).  Normal CMP.  H/H9 0.6/32 with MCV 68 in 08/2013.  Was recently seen by Dr. Bettina Gavia in the Cardiology Clinic for SOB, fatigue,DOE, hypertension.  Diagnosed with CHF and started  on Lasix and Aldactone.  EKG normal.  TTE was otherwise reassuring (EF 60-65%).  Plan for additional work-up and treatment pending evaluation and treatment of underlying anemia.  Past medical history, past surgical history, social history, family history, medications, and allergies reviewed in the chart and with patient.    Past Medical History:  Diagnosis Date  . Essential hypertension 08/19/2016  . Morbid obesity ([place])      Past Surgical History:  Procedure Laterality Date  . TUBAL LIGATION     Family History  Problem Relation Age of Onset  . Stroke Maternal Aunt   . Cancer Neg Hx   . Heart attack Neg Hx   . Colon cancer Neg Hx   . Esophageal cancer Neg Hx    Social History   Tobacco Use  . Smoking status: Never Smoker  . Smokeless tobacco: Never Used  Substance Use Topics  . Alcohol use: Yes    Comment: ocassionally  . Drug use: Not Currently   Current Outpatient Medications  Medication Sig Dispense Refill  . amLODipine-valsartan (EXFORGE) 5-320 MG tablet Take 1 tablet by mouth daily. 30 tablet 2  . ferrous sulfate 325 (65 FE) MG EC tablet Take 325 mg by mouth 3 (three) times daily with meals.    . furosemide (LASIX) 20 MG tablet Take 1 tablet (20 mg total) by mouth daily. 90 tablet 1  . ketoconazole (NIZORAL) 2 % cream Apply 1 application topically daily. 60 g 0  . metoprolol  succinate (TOPROL-XL) 50 MG 24 hr tablet Take 1 tablet (50 mg total) by mouth daily. Take with or immediately following a meal. 30 tablet 3  . spironolactone (ALDACTONE) 25 MG tablet Take 1 tablet (25 mg total) by mouth daily. 90 tablet 1  . vitamin B-12 (CYANOCOBALAMIN) 1000 MCG tablet Take 1,000 mcg by mouth daily.     No current facility-administered medications for this visit.    No Known Allergies   Review of Systems: All systems reviewed and negative except where noted in HPI.     Physical Exam:    Physical exam not completed due to the nature of this telehealth communication.   Patient was otherwise alert and oriented and well communicative.   ASSESSMENT AND PLAN;   1) Iron Deficiency Anemia: Discussed etiology for IDA at length, to include occult GI blood loss, malabsorption, and poor dietary iron intake.  Her diet is otherwise rich in iron-containing foods, so low suspicion for dietary etiology.  Will evaluate as below:  - EGD with duodenal biopsies - Colonoscopy - Resume iron therapy as currently prescribed - Stop iron 7 days prior to colonoscopy -Offered to set up for IV iron infusion given hemoglobin 8.3.  She declined - Check Celiac serologies - We will send vitamin D to check for co-malabsorption.  We will hold off on additional fat-soluble vitamins at this time pending results - If the above evaluation is unrevealing, will likely proceed with VCE for small bowel interrogation versus cross-sectional imaging, pending additional GYN evaluation as below - To follow-up in the GI clinic after the above studies are complete  2) Menorrhagia: Heavy menses for the last 1 to 2 years.  Have higher suspicion that this is etiology for the above IDA, but her recall the timeline does not exactly line up.  Suspect this is been ongoing for longer than she recalls.  - Follow-up with her PCM with consideration for referral to GYN pending endoscopic work-up above  3) HTN 4) SOB/DOE - Likely related to the above IDA. Will eval for clinical improvement with iron therapy. As above, offered IV iron to expedite, but declined   The indications, risks, and benefits of EGD and colonoscopy were explained to the patient in detail. Risks include but are not limited to bleeding, perforation, adverse reaction to medications, and cardiopulmonary compromise. Sequelae include but are not limited to the possibility of surgery, hositalization, and mortality. The patient verbalized understanding and wished to proceed. All questions answered, referred to scheduler and bowel prep ordered. Further  recommendations pending results of the exam.     Lavena Bullion, DO, FACG  10/25/2018, 3:04 PM   [place], [place]*

## 2018-10-28 ENCOUNTER — Other Ambulatory Visit (INDEPENDENT_AMBULATORY_CARE_PROVIDER_SITE_OTHER): Payer: Commercial Managed Care - PPO

## 2018-10-28 ENCOUNTER — Other Ambulatory Visit: Payer: Self-pay

## 2018-10-28 ENCOUNTER — Telehealth: Payer: Self-pay | Admitting: Family Medicine

## 2018-10-28 DIAGNOSIS — D509 Iron deficiency anemia, unspecified: Secondary | ICD-10-CM

## 2018-10-28 DIAGNOSIS — N921 Excessive and frequent menstruation with irregular cycle: Secondary | ICD-10-CM

## 2018-10-28 NOTE — Telephone Encounter (Signed)
Pt came in office to have labs done for GI, pt wanted to ask provider that she is taking meds and three days ago she started to feel numb her right knee, pt states it only happens when she takes the meds in the morning then the sensation goes away after 4 to 5 hours. Pt wants to know if it has to do with her meds. Please advise. [phone]

## 2018-10-28 NOTE — Telephone Encounter (Signed)
I am not convinced this is her meds? Odd timing and duration. If it is bothersome, I would like to evaluate her in person. Ty.

## 2018-10-31 NOTE — Telephone Encounter (Signed)
Called left message to call back 

## 2018-10-31 NOTE — Telephone Encounter (Signed)
Scheduled appt for Tuesday 11/01/2018.

## 2018-11-01 ENCOUNTER — Other Ambulatory Visit: Payer: Self-pay

## 2018-11-01 ENCOUNTER — Encounter: Payer: Self-pay | Admitting: Gastroenterology

## 2018-11-01 ENCOUNTER — Ambulatory Visit (INDEPENDENT_AMBULATORY_CARE_PROVIDER_SITE_OTHER): Payer: Commercial Managed Care - PPO | Admitting: Family Medicine

## 2018-11-01 ENCOUNTER — Encounter: Payer: Self-pay | Admitting: Family Medicine

## 2018-11-01 DIAGNOSIS — I10 Essential (primary) hypertension: Secondary | ICD-10-CM

## 2018-11-01 LAB — TISSUE TRANSGLUTAMINASE, IGA: (tTG) Ab, IgA: 1 U/mL

## 2018-11-01 LAB — RETICULIN ANTIBODIES, IGA W TITER: Reticulin IGA Screen: NEGATIVE

## 2018-11-01 LAB — GLIADIN ANTIBODIES, SERUM
Gliadin IgA: 11 Units
Gliadin IgG: 3 Units

## 2018-11-01 LAB — EXTRA LAV TOP TUBE

## 2018-11-01 MED ORDER — CHLORTHALIDONE 25 MG PO TABS: 25.0000 mg | ORAL_TABLET | Freq: Every day | ORAL | 3 refills | Status: DC

## 2018-11-01 NOTE — Progress Notes (Signed)
Chief Complaint  Patient presents with  . Follow-up    blood pressure    Subjective Tiffany Peterson is a 48 y.o. female who presents for hypertension follow up. Due to COVID-19 pandemic, we are interacting via web portal for an electronic face-to-face visit. I verified patient's ID using 2 identifiers. Patient agreed to proceed with visit via this method. Patient is at home, I am at home. Patient and I are present for visit.   She does monitor home blood pressures. Blood pressures ranging from 150-160's/90-100's on average. She is compliant with medications- Exforge 5-320 mg/d, spironolactone 25 mg/d, Toprol XL 50 mg/d (added most recently). Pt is also on Lasix from cards without potassium supp due to being on K sparing diuretic and ARB.  Patient has these side effects of medication: numbing sensation on back of R thigh after starting beta blocker.  She is sometimes adhering to a healthy diet overall. Current exercise: none, awaiting a heart procedure   Past Medical History:  Diagnosis Date  . Essential hypertension 08/19/2016  . Morbid obesity ([place])     Review of Systems Cardiovascular: no chest pain Respiratory:  no shortness of breath  Exam No conversational dyspnea Age appropriate judgment and insight Nml affect and mood  Essential hypertension - Plan: chlorthalidone (HYGROTON) 25 MG tablet, Basic metabolic panel  Orders as above. Stop Toprol due to possible AE. Start chlorthalidone. Considered starting K, will ck BMP in 1 week. Cont ck'ing BP at home.  Counseled on diet and exercise. F/u in 1 week for labs, 2 weeks from Fri for in person visit. The patient voiced understanding and agreement to the plan.  [place], DO 11/01/18  3:50 PM

## 2018-11-08 ENCOUNTER — Encounter: Payer: Commercial Managed Care - PPO | Admitting: Gastroenterology

## 2018-11-13 ENCOUNTER — Telehealth: Payer: Self-pay | Admitting: *Deleted

## 2018-11-13 NOTE — Telephone Encounter (Signed)
Mailbox is full.

## 2018-11-13 NOTE — Telephone Encounter (Signed)
Voicemail is full unable to leave a message 

## 2018-11-14 ENCOUNTER — Other Ambulatory Visit: Payer: Self-pay

## 2018-11-14 ENCOUNTER — Other Ambulatory Visit (INDEPENDENT_AMBULATORY_CARE_PROVIDER_SITE_OTHER): Payer: Commercial Managed Care - PPO

## 2018-11-14 DIAGNOSIS — I10 Essential (primary) hypertension: Secondary | ICD-10-CM

## 2018-11-15 ENCOUNTER — Encounter: Payer: Self-pay | Admitting: Gastroenterology

## 2018-11-15 ENCOUNTER — Ambulatory Visit (AMBULATORY_SURGERY_CENTER): Payer: Commercial Managed Care - PPO | Admitting: Gastroenterology

## 2018-11-15 VITALS — BP 141/91 | HR 57 | Temp 98.5°F | Resp 17 | Ht 65.0 in | Wt 252.0 lb

## 2018-11-15 DIAGNOSIS — K2970 Gastritis, unspecified, without bleeding: Secondary | ICD-10-CM | POA: Diagnosis not present

## 2018-11-15 DIAGNOSIS — D509 Iron deficiency anemia, unspecified: Secondary | ICD-10-CM

## 2018-11-15 DIAGNOSIS — K648 Other hemorrhoids: Secondary | ICD-10-CM | POA: Diagnosis not present

## 2018-11-15 DIAGNOSIS — D122 Benign neoplasm of ascending colon: Secondary | ICD-10-CM | POA: Diagnosis present

## 2018-11-15 DIAGNOSIS — K5730 Diverticulosis of large intestine without perforation or abscess without bleeding: Secondary | ICD-10-CM | POA: Diagnosis not present

## 2018-11-15 DIAGNOSIS — K5710 Diverticulosis of small intestine without perforation or abscess without bleeding: Secondary | ICD-10-CM

## 2018-11-15 DIAGNOSIS — K640 First degree hemorrhoids: Secondary | ICD-10-CM

## 2018-11-15 DIAGNOSIS — B9681 Helicobacter pylori [H. pylori] as the cause of diseases classified elsewhere: Secondary | ICD-10-CM

## 2018-11-15 LAB — BASIC METABOLIC PANEL
BUN: 21 mg/dL (ref 6–23)
CO2: 27 mEq/L (ref 19–32)
Calcium: 9 mg/dL (ref 8.4–10.5)
Chloride: 99 mEq/L (ref 96–112)
Creatinine, Ser: 1.23 mg/dL — ABNORMAL HIGH (ref 0.40–1.20)
GFR: 55.87 mL/min — ABNORMAL LOW (ref >60.00)
Glucose, Bld: 111 mg/dL — ABNORMAL HIGH (ref 70–99)
Potassium: 4.4 mEq/L (ref 3.5–5.1)
Sodium: 136 mEq/L (ref 135–145)

## 2018-11-15 MED ORDER — SODIUM CHLORIDE 0.9 % IV SOLN: 500.0000 mL | Freq: Once | INTRAVENOUS | Status: DC

## 2018-11-15 NOTE — Progress Notes (Signed)
Spontaneous respirations throughout. VSS. Resting comfortably. To PACU on room air. Report to  RN. 

## 2018-11-15 NOTE — Progress Notes (Signed)
No problems noted in the recovery room. maw 

## 2018-11-15 NOTE — Op Note (Signed)
[place] Patient Name: Tiffany Peterson Procedure Date: 11/15/2018 3:25 PM MRN: [id number] Endoscopist: Gerrit Heck , MD Age: 48 Referring MD:  Date of Birth: 11-09-1970 Gender: Female Account #: [id number] Procedure:                Upper GI endoscopy Indications:              Iron deficiency anemia                           48 yo female with IDA without overt GI blood loss.                            No previous EGD or colonoscopy. Recent labs notable                            for ferritin 6.8 (was 6 2 years ago), iron 20, TIBC                            366, iron saturation 3.9%, H/H 8.7/28.2 with                            MCV/RDW 65.6/17 (was 10.3/33.5 in 2018). 48 Medicines:                Monitored Anesthesia Care Procedure:                Pre-Anesthesia Assessment:                           - Prior to the procedure, a History and Physical                            was performed, and patient medications and                            allergies were reviewed. The patient's tolerance of                            previous anesthesia was also reviewed. The risks                            and benefits of the procedure and the sedation                            options and risks were discussed with the patient.                            All questions were answered, and informed consent                            was obtained. Prior Anticoagulants: The patient has                            taken no previous anticoagulant or antiplatelet  agents. ASA Grade Assessment: II - A patient with                            mild systemic disease. After reviewing the risks                            and benefits, the patient was deemed in                            satisfactory condition to undergo the procedure.                           After obtaining informed consent, the endoscope was                            passed under direct vision.  Throughout the                            procedure, the patient's blood pressure, pulse, and                            oxygen saturations were monitored continuously. The                            Endoscope was introduced through the mouth, and                            advanced to the second part of duodenum. The upper                            GI endoscopy was accomplished without difficulty.                            The patient tolerated the procedure well. Scope In: Scope Out: Findings:                 The examined esophagus was normal.                           A small non-bleeding diverticulum was found in the                            gastric fundus.                           Scattered mild inflammation characterized by                            congestion (edema) and erythema was found in the                            gastric body and in the gastric antrum. Biopsies                            were taken with  a cold forceps for Helicobacter                            pylori testing. Estimated blood loss was minimal.                           The duodenal bulb and first portion of the duodenum                            were normal. Biopsies for histology were taken with                            a cold forceps for evaluation of celiac disease.                            Estimated blood loss was minimal. Complications:            No immediate complications. Estimated Blood Loss:     Estimated blood loss was minimal. Impression:               - Normal esophagus.                           - Gastric diverticulum.                           - Gastritis. Biopsied.                           - Normal duodenal bulb and first portion of the                            duodenum. Biopsied. Recommendation:           - Patient has a contact number available for                            emergencies. The signs and symptoms of potential                            delayed complications  were discussed with the                            patient. Return to normal activities tomorrow.                            Written discharge instructions were provided to the                            patient.                           - Resume previous diet today.                           - Continue present medications.                           -  Await pathology results.                           - Perform a colonoscopy today.                           - Follow-up with Gynecology for menorrhagia. Gerrit Heck, MD 11/15/2018 3:59:40 PM

## 2018-11-15 NOTE — Patient Instructions (Addendum)
YOU HAD AN ENDOSCOPIC PROCEDURE TODAY AT THE [facility]:   Refer to the procedure report that was given to you for any specific questions about what was found during the examination.  If the procedure report does not answer your questions, please call your gastroenterologist to clarify.  If you requested that your care partner not be given the details of your procedure findings, then the procedure report has been included in a sealed envelope for you to review at your convenience later.  YOU SHOULD EXPECT: Some feelings of bloating in the abdomen. Passage of more gas than usual.  Walking can help get rid of the air that was put into your GI tract during the procedure and reduce the bloating. If you had a lower endoscopy (such as a colonoscopy or flexible sigmoidoscopy) you may notice spotting of blood in your stool or on the toilet paper. If you underwent a bowel prep for your procedure, you may not have a normal bowel movement for a few days.  Please Note:  You might notice some irritation and congestion in your nose or some drainage.  This is from the oxygen used during your procedure.  There is no need for concern and it should clear up in a day or so.  SYMPTOMS TO REPORT IMMEDIATELY:   Following lower endoscopy (colonoscopy or flexible sigmoidoscopy):  Excessive amounts of blood in the stool  Significant tenderness or worsening of abdominal pains  Swelling of the abdomen that is new, acute  Fever of 100F or higher   Following upper endoscopy (EGD)  Vomiting of blood or coffee ground material  New chest pain or pain under the shoulder blades  Painful or persistently difficult swallowing  New shortness of breath  Fever of 100F or higher  Black, tarry-looking stools  For urgent or emergent issues, a gastroenterologist can be reached at any hour by calling [phone].   DIET:  We do recommend a small meal at first, but then you may proceed to your regular diet.  Drink  plenty of fluids but you should avoid alcoholic beverages for 24 hours.  ACTIVITY:  You should plan to take it easy for the rest of today and you should NOT DRIVE or use heavy machinery until tomorrow (because of the sedation medicines used during the test).    FOLLOW UP: Our staff will call the number listed on your records 48-72 hours following your procedure to check on you and address any questions or concerns that you may have regarding the information given to you following your procedure. If we do not reach you, we will leave a message.  We will attempt to reach you two times.  During this call, we will ask if you have developed any symptoms of COVID 19. If you develop any symptoms (ie: fever, flu-like symptoms, shortness of breath, cough etc.) before then, please call [phone].  If you test positive for Covid 19 in the 2 weeks post procedure, please call and report this information to us.    If any biopsies were taken you will be contacted by phone or by letter within the next 1-3 weeks.  Please call us at [phone] if you have not heard about the biopsies in 3 weeks.    SIGNATURES/CONFIDENTIALITY: You and/or your care partner have signed paperwork which will be entered into your electronic medical record.  These signatures attest to the fact that that the information above on your After Visit Summary has been reviewed and is   understood.  Full responsibility of the confidentiality of this discharge information lies with you and/or your care-partner.   Handouts were given to your care partner on Gastritis, Polyps, Hemorrhoids,  and Diverticulosis. You may resume your current medications today. Await biopsy results. Follow-up with Gynecology for menorrhagia. Return to GI clinic at appointment to be scheduled by the office staff.  They will call you with this appointment. Please call if any questions or concerns.

## 2018-11-15 NOTE — Progress Notes (Signed)
Called to room to assist during endoscopic procedure.  Patient ID and intended procedure confirmed with present staff. Received instructions for my participation in the procedure from the performing physician.  

## 2018-11-15 NOTE — Progress Notes (Signed)
Covid screening and temp done CW. Vital signs done per JB.

## 2018-11-15 NOTE — Op Note (Signed)
[facility] Patient Name: Tiffany Peterson Procedure Date: 11/15/2018 3:18 PM MRN: [id number] Endoscopist: Gerrit Heck , MD Age: 48 Referring MD:  Date of Birth: 10/23/1970 Gender: Female Account #: [id number] Procedure:                Colonoscopy Indications:              This is the patient's first colonoscopy, Iron                            deficiency anemia                           48 yo female with IDA witout overt GI blood loss.                            Recent labs notable for ferritin 6.8 (was 6 2 years                            ago), iron 20, TIBC 366, iron saturation 3.9%, H/H                            8.7/28.2 with MCV/RDW 65.6/17 (was 10.3/33.5 in                            2018). No previous EGD or colonoscopy. Medicines:                Monitored Anesthesia Care Procedure:                Pre-Anesthesia Assessment:                           - Prior to the procedure, a History and Physical                            was performed, and patient medications and                            allergies were reviewed. The patient's tolerance of                            previous anesthesia was also reviewed. The risks                            and benefits of the procedure and the sedation                            options and risks were discussed with the patient.                            All questions were answered, and informed consent                            was obtained. Prior Anticoagulants: The patient has  taken no previous anticoagulant or antiplatelet                            agents. ASA Grade Assessment: II - A patient with                            mild systemic disease. After reviewing the risks                            and benefits, the patient was deemed in                            satisfactory condition to undergo the procedure.                           After obtaining informed consent, the colonoscope                        was passed under direct vision. Throughout the                            procedure, the patient's blood pressure, pulse, and                            oxygen saturations were monitored continuously. The                            Colonoscope was introduced through the anus and                            advanced to the the terminal ileum. The colonoscopy                            was performed without difficulty. The patient                            tolerated the procedure well. The quality of the                            bowel preparation was adequate. The terminal ileum,                            ileocecal valve, appendiceal orifice, and rectum                            were photographed. Scope In: 3:37:25 PM Scope Out: 3:51:32 PM Scope Withdrawal Time: 0 hours 11 minutes 44 seconds  Total Procedure Duration: 0 hours 14 minutes 7 seconds  Findings:                 The perianal and digital rectal examinations were                            normal.  A 8 mm polyp was found in the proximal ascending                            colon. The polyp was sessile. The polyp was removed                            with a cold snare. Resection and retrieval were                            complete. Estimated blood loss was minimal.                           A few small-mouthed diverticula were found in the                            sigmoid colon. The remainder of the colon was                            normal appearing, without any areas of mucosal                            erythema, edema, erosions, or ulceration noted.                           Non-bleeding internal hemorrhoids were found during                            anoscopy. The hemorrhoids were small.                           Retroflexion in the rectum was not performed due to                            anatomy (narrowed rectal vault).                           The terminal ileum  appeared normal. Complications:            No immediate complications. Estimated Blood Loss:     Estimated blood loss was minimal. Impression:               - One 8 mm polyp in the proximal ascending colon,                            removed with a cold snare. Resected and retrieved.                           - Diverticulosis in the sigmoid colon.                           - Non-bleeding internal hemorrhoids.                           - The examined portion of the ileum was normal. Recommendation:           -  Patient has a contact number available for                            emergencies. The signs and symptoms of potential                            delayed complications were discussed with the                            patient. Return to normal activities tomorrow.                            Written discharge instructions were provided to the                            patient.                           - Resume previous diet today.                           - Continue present medications.                           - Await pathology results.                           - Repeat colonoscopy in 7-10 years for surveillance                            based on pathology results.                           - Return to GI clinic at appointment to be                            scheduled.                           - Follow-up in the Gynecology Clinic re:                            menorrhagia. Gerrit Heck, MD 11/15/2018 4:04:52 PM

## 2018-11-16 ENCOUNTER — Ambulatory Visit (INDEPENDENT_AMBULATORY_CARE_PROVIDER_SITE_OTHER): Payer: Commercial Managed Care - PPO | Admitting: Family Medicine

## 2018-11-16 ENCOUNTER — Other Ambulatory Visit: Payer: Self-pay

## 2018-11-16 ENCOUNTER — Encounter: Payer: Self-pay | Admitting: Family Medicine

## 2018-11-16 VITALS — BP 120/78 | HR 100 | Temp 98.6°F | Ht 65.0 in | Wt 249.0 lb

## 2018-11-16 DIAGNOSIS — R200 Anesthesia of skin: Secondary | ICD-10-CM | POA: Diagnosis not present

## 2018-11-16 DIAGNOSIS — I10 Essential (primary) hypertension: Secondary | ICD-10-CM | POA: Diagnosis not present

## 2018-11-16 MED ORDER — PREDNISONE 20 MG PO TABS: 40.0000 mg | ORAL_TABLET | Freq: Every day | ORAL | 0 refills | Status: AC

## 2018-11-16 NOTE — Patient Instructions (Addendum)
Keep the diet clean and stay active.  OK to check your blood pressures once weekly.   If the medicine doesn't help your leg, let me know.   Call Center for [place] at [facility] at [phone] for an appointment.  They are located at [address], [place], [place], [address] (right across the hall from our office).   Let us know if you need anything.

## 2018-11-16 NOTE — Progress Notes (Signed)
Chief Complaint  Patient presents with  . Follow-up    Subjective Tiffany Peterson is a 48 y.o. female who presents for hypertension follow up. She does monitor home blood pressures. Blood pressures ranging from 120-130's/70's on average. She is compliant with medications- Exforge 5-320 mg/d, chlorthalidone 25 mg/d, metoprolol XL 50 mg/d, spironolactone 25 mg/d. Patient has these side effects of medication: none She is not adhering to a healthy diet overall. Current exercise: none  Over past 2 weeks, has had issues with numbness/burning on the outside of her R thigh. No injr or change in activity. No change in clothing and does not wear tight pants or belts. No weakness or skin changes.    Past Medical History:  Diagnosis Date  . Anemia   . Essential hypertension 08/19/2016  . Morbid obesity ([place])     Review of Systems Cardiovascular: no chest pain Respiratory:  no shortness of breath  Exam BP 120/78 (BP Location: Left Arm, Patient Position: Sitting, Cuff Size: Large)   Pulse 100   Temp 98.6 F (37 C) (Oral)   Ht 5\' 5"  (1.651 m)   Wt 249 lb (112.9 kg)   SpO2 98%   BMI 41.44 kg/m  General:  well developed, well nourished, in no apparent distress Heart: RRR, no bruits, no LE edema Lungs: clear to auscultation, no accessory muscle use Neuro: Sensation intact to pinprick bl with exception of decreased sensation Psych: well oriented with normal range of affect and appropriate judgment/insight  Essential hypertension  Thigh numbness  1- Cont meds for above. She is finally controlled. Counseled on diet and exercise. 2- Sounds like meralgia paresthetica. Will trial pred burst, gabapentin if no better.  F/u in 6 weeks to reck thigh. If no better will refer to PM&R or pain management for possible injection.  The patient voiced understanding and agreement to the plan.  [place], DO 11/16/18  3:41 PM

## 2018-11-17 ENCOUNTER — Telehealth: Payer: Self-pay

## 2018-11-17 NOTE — Telephone Encounter (Signed)
  Follow up Call-  Call back number 11/15/2018  Post procedure Call Back phone  # [phone]  Permission to leave phone message Yes  Some recent data might be hidden     No ID on voicemail; no message left

## 2018-11-21 ENCOUNTER — Telehealth: Payer: Self-pay

## 2018-11-21 ENCOUNTER — Telehealth: Payer: Self-pay | Admitting: *Deleted

## 2018-11-21 NOTE — Telephone Encounter (Signed)
Mail box is full.  Unable to leave message on 2nd follow up call.

## 2018-11-21 NOTE — Telephone Encounter (Signed)
  Follow up Call-  Call back number 11/15/2018  Post procedure Call Back phone  # [phone]  Permission to leave phone message Yes  Some recent data might be hidden     Patient questions:  Vm box is full.

## 2018-11-24 ENCOUNTER — Ambulatory Visit: Payer: Commercial Managed Care - PPO | Admitting: Cardiology

## 2018-11-25 ENCOUNTER — Ambulatory Visit: Payer: Commercial Managed Care - PPO | Admitting: Cardiology

## 2018-11-28 ENCOUNTER — Other Ambulatory Visit: Payer: Self-pay

## 2018-11-28 DIAGNOSIS — B9681 Helicobacter pylori [H. pylori] as the cause of diseases classified elsewhere: Secondary | ICD-10-CM

## 2018-11-28 DIAGNOSIS — K2970 Gastritis, unspecified, without bleeding: Secondary | ICD-10-CM

## 2018-11-28 MED ORDER — DOXYCYCLINE MONOHYDRATE 100 MG PO TABS: 100.0000 mg | ORAL_TABLET | Freq: Two times a day (BID) | ORAL | 0 refills | Status: AC

## 2018-11-28 MED ORDER — METRONIDAZOLE 250 MG PO TABS: 250.0000 mg | ORAL_TABLET | Freq: Four times a day (QID) | ORAL | 0 refills | Status: AC

## 2018-11-28 MED ORDER — OMEPRAZOLE 20 MG PO CPDR: 20.0000 mg | DELAYED_RELEASE_CAPSULE | Freq: Two times a day (BID) | ORAL | 0 refills | Status: DC

## 2018-11-28 MED ORDER — BISMUTH SUBSALICYLATE 262 MG PO CHEW: 524.0000 mg | CHEWABLE_TABLET | Freq: Four times a day (QID) | ORAL | 0 refills | Status: AC

## 2018-11-30 NOTE — Progress Notes (Signed)
Cardiology Office Note:    Date:  12/01/2018   ID:  Tiffany Peterson, DOB 12-06-1970, MRN [id number]  PCP:  Shelda Pal, DO  Cardiologist:  Shirlee More, MD    Referring MD: Shelda Pal*    ASSESSMENT:    1. Anemia, unspecified type   2. Meralgia paraesthetica, right   3. Essential hypertension   4. Chest pain in adult    PLAN:    In order of problems listed above:  1. Check iron studies CBC I suspect she is severely anemic and may require parenteral iron treatment 2. She will discuss this finding with GYN at office follow-up 3. Stable BP at target she is taking 3 different diuretics.  Her loop diuretic continue spironolactone furosemide and beta-blocker BP at target 4. No recurrence at this time I would not pursue an ischemia evaluation with normal echocardiogram   Next appointment: PRN   Medication Adjustments/Labs and Tests Ordered: Current medicines are reviewed at length with the patient today.  Concerns regarding medicines are outlined above.  Orders Placed This Encounter  Procedures  . CBC  . Ferritin   No orders of the defined types were placed in this encounter.   Chief Complaint  Patient presents with  . Follow-up    after an echo    History of Present Illness:    Tiffany Peterson is a 48 y.o. female with a hx of HTN, anemia, SOB last seen 10/12/18 for chest pain at consult of her PCP Dr. Nani Ravens. She presents today to follow up on echocardiogram 10/19/18 as as consideration of ischemia evaluation after correction of severe anemia.  Compliance with diet, lifestyle and medications: Yes  She continues to feel weak and is having prolonged menstrual cycles and is due to be seen by GYN later this month.  She has had no follow-up for anemia and I will draw an H&H and ferritin level today.  She is also complaining of burning in her right thigh, meralgia paresthetica he can be seen with GYN pathology and I asked her to mention this when she  sees the gynecologist she may require imaging ultrasound or CT.  Presently is not having shortness of breath or chest pain her echocardiogram is normal heart hypertension is controlled and I do not feel a compelling need to do an ischemia evaluation.  I will plan to see back in my office as needed. Past Medical History:  Diagnosis Date  . Anemia   . Essential hypertension 08/19/2016  . Morbid obesity ([place])     Past Surgical History:  Procedure Laterality Date  . TUBAL LIGATION      Current Medications: Current Meds  Medication Sig  . amLODipine-valsartan (EXFORGE) 5-320 MG tablet Take 1 tablet by mouth daily.  . ferrous sulfate 325 (65 FE) MG EC tablet Take 325 mg by mouth 3 (three) times daily with meals.  . furosemide (LASIX) 20 MG tablet Take 1 tablet (20 mg total) by mouth daily.  . metoprolol succinate (TOPROL-XL) 50 MG 24 hr tablet Take 1 tablet (50 mg total) by mouth daily. Take with or immediately following a meal.  . spironolactone (ALDACTONE) 25 MG tablet Take 1 tablet (25 mg total) by mouth daily.  . vitamin B-12 (CYANOCOBALAMIN) 1000 MCG tablet Take 1,000 mcg by mouth daily.  . [DISCONTINUED] chlorthalidone (HYGROTON) 25 MG tablet Take 1 tablet (25 mg total) by mouth daily.     Allergies:   Patient has no known allergies.   Social History  Socioeconomic History  . Marital status: Married    Spouse name: Not on file  . Number of children: Not on file  . Years of education: Not on file  . Highest education level: Not on file  Occupational History  . Not on file  Social Needs  . Financial resource strain: Not on file  . Food insecurity    Worry: Not on file    Inability: Not on file  . Transportation needs    Medical: Not on file    Non-medical: Not on file  Tobacco Use  . Smoking status: Never Smoker  . Smokeless tobacco: Never Used  Substance and Sexual Activity  . Alcohol use: Yes    Comment: ocassionally  . Drug use: Not Currently  . Sexual activity:  Not on file  Lifestyle  . Physical activity    Days per week: Not on file    Minutes per session: Not on file  . Stress: Not on file  Relationships  . Social Herbalist on phone: Not on file    Gets together: Not on file    Attends religious service: Not on file    Active member of club or organization: Not on file    Attends meetings of clubs or organizations: Not on file    Relationship status: Not on file  Other Topics Concern  . Not on file  Social History Narrative  . Not on file     Family History: The patient's family history includes Stroke in her maternal aunt. There is no history of Cancer, Heart attack, Colon cancer, or Esophageal cancer. ROS:   Please see the history of present illness.    All other systems reviewed and are negative.  EKGs/Labs/Other Studies Reviewed:    The following studies were reviewed today:  Echo 10/19/18:   IMPRESSIONS  1. The left ventricle has normal systolic function with an ejection fraction of 60-65%. The cavity size was normal. There is mild concentric left ventricular hypertrophy. Left ventricular diastolic parameters were normal.  2. The right ventricle has normal systolic function. The cavity was normal. There is no increase in right ventricular wall thickness.  3. Aortic valve regurgitation is mild by color flow Doppler. No stenosis of the aortic valve.  4. The aortic root and aortic arch are normal in size and structure.  5. 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  Recent Labs: 10/03/2018: ALT 13; Hemoglobin 8.7 Repeated and verified X2.; Magnesium 1.9; Platelets 385.0 11/14/2018: BUN 21; Creatinine, Ser 1.23; Potassium 4.4; Sodium 136  Recent Lipid Panel No results found for: CHOL, TRIG, HDL, CHOLHDL, VLDL, LDLCALC, LDLDIRECT  Physical Exam:    VS:  BP 130/78 (BP Location: Left Arm, Patient Position: Sitting, Cuff Size: Large)   Pulse 68   Temp (!) 97.3 F (36.3 C)   Ht 5\' 5"  (1.651 m)    Wt 254 lb (115.2 kg)   SpO2 100%   BMI 42.27 kg/m     Wt Readings from Last 3 Encounters:  12/01/18 254 lb (115.2 kg)  11/16/18 249 lb (112.9 kg)  11/15/18 252 lb (114.3 kg)     GEN:  Well nourished, well developed in no acute distress HEENT: Normal NECK: No JVD; No carotid bruits LYMPHATICS: No lymphadenopathy CARDIAC: RRR, no murmurs, rubs, gallops RESPIRATORY:  Clear to auscultation without rales, wheezing or rhonchi  ABDOMEN: Soft, non-tender, non-distended MUSCULOSKELETAL:  No edema; No deformity  SKIN: Warm and dry pallor  of skin and membranes NEUROLOGIC:  Alert and oriented x 3 PSYCHIATRIC:  Normal affect    Signed, Shirlee More, MD  12/01/2018 4:26 PM    [facility]

## 2018-12-01 ENCOUNTER — Ambulatory Visit (INDEPENDENT_AMBULATORY_CARE_PROVIDER_SITE_OTHER): Payer: Commercial Managed Care - PPO | Admitting: Cardiology

## 2018-12-01 ENCOUNTER — Encounter: Payer: Self-pay | Admitting: Cardiology

## 2018-12-01 ENCOUNTER — Other Ambulatory Visit: Payer: Self-pay

## 2018-12-01 VITALS — BP 130/78 | HR 68 | Temp 97.3°F | Ht 65.0 in | Wt 254.0 lb

## 2018-12-01 DIAGNOSIS — D649 Anemia, unspecified: Secondary | ICD-10-CM | POA: Diagnosis not present

## 2018-12-01 DIAGNOSIS — I10 Essential (primary) hypertension: Secondary | ICD-10-CM | POA: Diagnosis not present

## 2018-12-01 DIAGNOSIS — G5711 Meralgia paresthetica, right lower limb: Secondary | ICD-10-CM | POA: Insufficient documentation

## 2018-12-01 DIAGNOSIS — R079 Chest pain, unspecified: Secondary | ICD-10-CM

## 2018-12-01 NOTE — Patient Instructions (Addendum)
Medication Instructions:  Your physician has recommended you make the following change in your medication:   STOP: Chlorthadone   If you need a refill on your cardiac medications before your next appointment, please call your pharmacy.   Lab work: Your physician recommends that you return for lab work in: TODAY CBC,Ferritin  If you have labs (blood work) drawn today and your tests are completely normal, you will receive your results only by: Marland Kitchen MyChart Message (if you have MyChart) OR . A paper copy in the mail If you have any lab test that is abnormal or we need to change your treatment, we will call you to review the results.  Testing/Procedures: None  Follow-Up: At [facility], you and your health needs are our priority.  As part of our continuing mission to provide you with exceptional heart care, we have created designated Provider Care Teams.  These Care Teams include your primary Cardiologist (physician) and Advanced Practice Providers (APPs -  Physician Assistants and Nurse Practitioners) who all work together to provide you with the care you need, when you need it. You will need a follow up appointment as needed Any Other Special Instructions Will Be Listed Below (If Applicable).

## 2018-12-02 LAB — CBC
Hematocrit: 34.7 % (ref 34.0–46.6)
Hemoglobin: 11 g/dL — ABNORMAL LOW (ref 11.1–15.9)
MCH: 23.9 pg — ABNORMAL LOW (ref 26.6–33.0)
MCHC: 31.7 g/dL (ref 31.5–35.7)
MCV: 75 fL — ABNORMAL LOW (ref 79–97)
Platelets: 315 10*3/uL (ref 150–450)
RBC: 4.6 x10E6/uL (ref 3.77–5.28)
WBC: 7.2 10*3/uL (ref 3.4–10.8)

## 2018-12-02 LAB — FERRITIN: Ferritin: 84 ng/mL (ref 15–150)

## 2018-12-07 ENCOUNTER — Encounter: Payer: Self-pay | Admitting: Family Medicine

## 2018-12-07 ENCOUNTER — Ambulatory Visit (INDEPENDENT_AMBULATORY_CARE_PROVIDER_SITE_OTHER): Payer: Commercial Managed Care - PPO | Admitting: Family Medicine

## 2018-12-07 ENCOUNTER — Other Ambulatory Visit: Payer: Self-pay

## 2018-12-07 VITALS — Ht 65.0 in | Wt 256.0 lb

## 2018-12-07 DIAGNOSIS — N870 Mild cervical dysplasia: Secondary | ICD-10-CM

## 2018-12-07 DIAGNOSIS — N921 Excessive and frequent menstruation with irregular cycle: Secondary | ICD-10-CM

## 2018-12-07 DIAGNOSIS — N939 Abnormal uterine and vaginal bleeding, unspecified: Secondary | ICD-10-CM

## 2018-12-07 DIAGNOSIS — Z124 Encounter for screening for malignant neoplasm of cervix: Secondary | ICD-10-CM

## 2018-12-07 DIAGNOSIS — Z01419 Encounter for gynecological examination (general) (routine) without abnormal findings: Secondary | ICD-10-CM

## 2018-12-07 DIAGNOSIS — N920 Excessive and frequent menstruation with regular cycle: Secondary | ICD-10-CM | POA: Insufficient documentation

## 2018-12-07 DIAGNOSIS — D500 Iron deficiency anemia secondary to blood loss (chronic): Secondary | ICD-10-CM | POA: Insufficient documentation

## 2018-12-07 MED ORDER — MEGESTROL ACETATE 40 MG PO TABS: 40.0000 mg | ORAL_TABLET | Freq: Two times a day (BID) | ORAL | 3 refills | Status: DC

## 2018-12-07 NOTE — Patient Instructions (Signed)
Preventive Care 40-64 Years, Female Preventive care refers to lifestyle choices and visits with your health care provider that can promote health and wellness. What does preventive care include?   A yearly physical exam. This is also called an annual well check.  Dental exams once or twice a year.  Routine eye exams. Ask your health care provider how often you should have your eyes checked.  Personal lifestyle choices, including: ? Daily care of your teeth and gums. ? Regular physical activity. ? Eating a healthy diet. ? Avoiding tobacco and drug use. ? Limiting alcohol use. ? Practicing safe sex. ? Taking low-dose aspirin daily starting at age 50. ? Taking vitamin and mineral supplements as recommended by your health care provider. What happens during an annual well check? The services and screenings done by your health care provider during your annual well check will depend on your age, overall health, lifestyle risk factors, and family history of disease. Counseling Your health care provider may ask you questions about your:  Alcohol use.  Tobacco use.  Drug use.  Emotional well-being.  Home and relationship well-being.  Sexual activity.  Eating habits.  Work and work environment.  Method of birth control.  Menstrual cycle.  Pregnancy history. Screening You may have the following tests or measurements:  Height, weight, and BMI.  Blood pressure.  Lipid and cholesterol levels. These may be checked every 5 years, or more frequently if you are over 50 years old.  Skin check.  Lung cancer screening. You may have this screening every year starting at age 55 if you have a 30-pack-year history of smoking and currently smoke or have quit within the past 15 years.  Colorectal cancer screening. All adults should have this screening starting at age 50 and continuing until age 75. Your health care provider may recommend screening at age 45. You will have tests every  1-10 years, depending on your results and the type of screening test. People at increased risk should start screening at an earlier age. Screening tests may include: ? Guaiac-based fecal occult blood testing. ? Fecal immunochemical test (FIT). ? Stool DNA test. ? Virtual colonoscopy. ? Sigmoidoscopy. During this test, a flexible tube with a tiny camera (sigmoidoscope) is used to examine your rectum and lower colon. The sigmoidoscope is inserted through your anus into your rectum and lower colon. ? Colonoscopy. During this test, a long, thin, flexible tube with a tiny camera (colonoscope) is used to examine your entire colon and rectum.  Hepatitis C blood test.  Hepatitis B blood test.  Sexually transmitted disease (STD) testing.  Diabetes screening. This is done by checking your blood sugar (glucose) after you have not eaten for a while (fasting). You may have this done every 1-3 years.  Mammogram. This may be done every 1-2 years. Talk to your health care provider about when you should start having regular mammograms. This may depend on whether you have a family history of breast cancer.  BRCA-related cancer screening. This may be done if you have a family history of breast, ovarian, tubal, or peritoneal cancers.  Pelvic exam and Pap test. This may be done every 3 years starting at age 21. Starting at age 30, this may be done every 5 years if you have a Pap test in combination with an HPV test.  Bone density scan. This is done to screen for osteoporosis. You may have this scan if you are at high risk for osteoporosis. Discuss your test results, treatment options,   and if necessary, the need for more tests with your health care provider. Vaccines Your health care provider may recommend certain vaccines, such as:  Influenza vaccine. This is recommended every year.  Tetanus, diphtheria, and acellular pertussis (Tdap, Td) vaccine. You may need a Td booster every 10 years.  Varicella  vaccine. You may need this if you have not been vaccinated.  Zoster vaccine. You may need this after age 38.  Measles, mumps, and rubella (MMR) vaccine. You may need at least one dose of MMR if you were born in 1957 or later. You may also need a second dose.  Pneumococcal 13-valent conjugate (PCV13) vaccine. You may need this if you have certain conditions and were not previously vaccinated.  Pneumococcal polysaccharide (PPSV23) vaccine. You may need one or two doses if you smoke cigarettes or if you have certain conditions.  Meningococcal vaccine. You may need this if you have certain conditions.  Hepatitis A vaccine. You may need this if you have certain conditions or if you travel or work in places where you may be exposed to hepatitis A.  Hepatitis B vaccine. You may need this if you have certain conditions or if you travel or work in places where you may be exposed to hepatitis B.  Haemophilus influenzae type b (Hib) vaccine. You may need this if you have certain conditions. Talk to your health care provider about which screenings and vaccines you need and how often you need them. This information is not intended to replace advice given to you by your health care provider. Make sure you discuss any questions you have with your health care provider. Document Released: 06/28/2015 Document Revised: 07/22/2017 Document Reviewed: 04/02/2015 Elsevier Interactive Patient Education  2019 [employer].

## 2018-12-07 NOTE — Progress Notes (Signed)
  Subjective:     Tiffany Peterson is a 48 y.o. female and is here for a comprehensive physical exam. The patient reports problems - abnormal bleeding. Bleeding x 15 days/month x 6-18 months off and on.Prior cycle was 7 days. Denies hot flashes, but has gone 1-2 months without a cycle. ? Menopause. Hgb down to 8.7 prior, but now up to 11. Begun on spirinolactone for BP.   The following portions of the patient's history were reviewed and updated as appropriate: allergies, current medications, past family history, past medical history, past social history, past surgical history and problem list.  Review of Systems Pertinent items noted in HPI and remainder of comprehensive ROS otherwise negative.   Objective:    Ht 5\' 5"  (1.651 m)   Wt 256 lb (116.1 kg)   LMP 11/22/2018 (Exact Date) Comment: extended period 15 days  BMI 42.60 kg/m  General appearance: alert, cooperative, appears stated age and moderately obese Head: Normocephalic, without obvious abnormality, atraumatic Neck: no adenopathy, supple, symmetrical, trachea midline and thyroid not enlarged, symmetric, no tenderness/mass/nodules Lungs: clear to auscultation bilaterally Breasts: normal appearance, no masses or tenderness Heart: regular rate and rhythm, S1, S2 normal, no murmur, click, rub or gallop Abdomen: soft, non-tender; bowel sounds normal; no masses,  no organomegaly Pelvic: cervix normal in appearance, external genitalia normal, no adnexal masses or tenderness, no cervical motion tenderness, uterus normal size, shape, and consistency and vagina normal without discharge Extremities: extremities normal, atraumatic, no cyanosis or edema Pulses: 2+ and symmetric Skin: Skin color, texture, turgor normal. No rashes or lesions Lymph nodes: Cervical, supraclavicular, and axillary nodes normal. Neurologic: Grossly normal    Assessment:    GYN female exam.      Plan:      Problem List Items Addressed This Visit      Unprioritized   Mild dysplasia of cervix (CIN I)    Repeat pap smear today      Iron deficiency anemia due to chronic blood loss    Improved right now, on Iron, B12      Menorrhagia    Check labs, TSH, FSH, pelvic sono--may need EMB (likely). Discussed with patient.       Other Visit Diagnoses    Abnormal bleeding in menstrual cycle    -  Primary   Relevant Medications   megestrol (MEGACE) 40 MG tablet   Other Relevant Orders   Follicle stimulating hormone   TSH   US PELVIC COMPLETE WITH TRANSVAGINAL   Screening for malignant neoplasm of cervix       Relevant Orders   Cytology - PAP( [place])   Encounter for gynecological examination without abnormal finding       Relevant Orders   Cytology - PAP( [place])     Return in 4 weeks (on 01/04/2019).  See After Visit Summary for Counseling Recommendations

## 2018-12-07 NOTE — Assessment & Plan Note (Signed)
Improved right now, on Iron, B12

## 2018-12-07 NOTE — Assessment & Plan Note (Signed)
Check labs, TSH, FSH, pelvic sono--may need EMB (likely). Discussed with patient.

## 2018-12-07 NOTE — Assessment & Plan Note (Signed)
Repeat pap smear today °

## 2018-12-08 LAB — TSH: TSH: 1.25 u[IU]/mL (ref 0.450–4.500)

## 2018-12-08 LAB — FOLLICLE STIMULATING HORMONE: FSH: 21.4 m[IU]/mL

## 2018-12-09 LAB — CYTOLOGY - PAP
Diagnosis: NEGATIVE
HPV: NOT DETECTED

## 2018-12-14 ENCOUNTER — Other Ambulatory Visit: Payer: Self-pay

## 2018-12-14 ENCOUNTER — Ambulatory Visit (HOSPITAL_BASED_OUTPATIENT_CLINIC_OR_DEPARTMENT_OTHER)
Admission: RE | Admit: 2018-12-14 | Discharge: 2018-12-14 | Disposition: A | Discharge status: Home / Self Care | Payer: Commercial Managed Care - PPO | Source: Ambulatory Visit | Attending: Family Medicine | Admitting: Family Medicine

## 2018-12-14 ENCOUNTER — Other Ambulatory Visit: Payer: Self-pay | Admitting: Family Medicine

## 2018-12-14 DIAGNOSIS — N939 Abnormal uterine and vaginal bleeding, unspecified: Secondary | ICD-10-CM | POA: Insufficient documentation

## 2018-12-15 ENCOUNTER — Telehealth: Payer: Self-pay

## 2018-12-15 NOTE — Telephone Encounter (Signed)
-----   Message from Donnamae Jude, MD sent at 12/15/2018  9:40 AM EDT ----- U/S looks ok--if bleeding persists, would need EMB. Let us know

## 2018-12-15 NOTE — Telephone Encounter (Signed)
Patient called and made aware that ultrasound was reviewed by dr. Kennon Rounds. If she has persistent bleeding that doesn't respond to the medication Dr. Kennon Rounds gave her she is to call us back. Patient states understanding. Kathrene Alu RN

## 2018-12-28 ENCOUNTER — Encounter: Payer: Self-pay | Admitting: Family Medicine

## 2018-12-28 ENCOUNTER — Ambulatory Visit (HOSPITAL_BASED_OUTPATIENT_CLINIC_OR_DEPARTMENT_OTHER)
Admission: RE | Admit: 2018-12-28 | Discharge: 2018-12-28 | Disposition: A | Discharge status: Home / Self Care | Payer: Commercial Managed Care - PPO | Source: Ambulatory Visit | Attending: Family Medicine | Admitting: Family Medicine

## 2018-12-28 ENCOUNTER — Other Ambulatory Visit: Payer: Self-pay

## 2018-12-28 ENCOUNTER — Ambulatory Visit (INDEPENDENT_AMBULATORY_CARE_PROVIDER_SITE_OTHER): Payer: Commercial Managed Care - PPO | Admitting: Family Medicine

## 2018-12-28 VITALS — BP 120/82 | HR 84 | Temp 98.6°F | Ht 65.0 in | Wt 261.0 lb

## 2018-12-28 DIAGNOSIS — M25571 Pain in right ankle and joints of right foot: Secondary | ICD-10-CM | POA: Insufficient documentation

## 2018-12-28 DIAGNOSIS — I10 Essential (primary) hypertension: Secondary | ICD-10-CM

## 2018-12-28 DIAGNOSIS — G5711 Meralgia paresthetica, right lower limb: Secondary | ICD-10-CM | POA: Diagnosis not present

## 2018-12-28 DIAGNOSIS — R200 Anesthesia of skin: Secondary | ICD-10-CM

## 2018-12-28 MED ORDER — AMLODIPINE BESYLATE-VALSARTAN 5-320 MG PO TABS: 1.0000 | ORAL_TABLET | Freq: Every day | ORAL | 2 refills | Status: DC

## 2018-12-28 MED ORDER — GABAPENTIN 400 MG PO CAPS: 400.0000 mg | ORAL_CAPSULE | Freq: Three times a day (TID) | ORAL | 3 refills | Status: DC

## 2018-12-28 NOTE — Patient Instructions (Addendum)
If this does not do the trick in the next several weeks, send me a message or call and we will get you in with a specialist.  Let us know if you need anything.  For your ankle:  Ice/cold pack over area for 10-15 min twice daily.  OK to take Tylenol 1000 mg (2 extra strength tabs) or 975 mg (3 regular strength tabs) every 6 hours as needed.  Ibuprofen 400-600 mg (2-3 over the counter strength tabs) every 6 hours as needed for pain.  Ankle Exercises It is normal to feel mild stretching, pulling, tightness, or discomfort as you do these exercises, but you should stop right away if you feel sudden pain or your pain gets worse.  Stretching and range of motion exercises These exercises warm up your muscles and joints and improve the movement and flexibility of your ankle. These exercises also help to relieve pain, numbness, and tingling. Exercise A: Dorsiflexion/Plantar Flexion   1. Sit with your affected knee straight or bent. Do not rest your foot on anything. 2. Flex your affected ankle to tilt the top of your foot toward your shin. 3. Hold this position for 5 seconds. 4. Point your toes downward to tilt the top of your foot away from your shin. 5. Hold this position for 5 seconds. Repeat 2 times. Complete this exercise 3 times per week. Exercise B: Ankle Alphabet   1. Sit with your affected foot supported at your lower leg. ? Do not rest your foot on anything. ? Make sure your foot has room to move freely. 2. Think of your affected foot as a paintbrush, and move your foot to trace each letter of the alphabet in the air. Keep your hip and knee still while you trace. Make the letters as large as you can without increasing any discomfort. 3. Trace every letter from A to Z. Repeat 2 times. Complete this exercise 3 times per week. Strengthening exercises These exercises build strength and endurance in your ankle. Endurance is the ability to use your muscles for a long time, even after  they get tired. Exercise D: Dorsiflexors   1. Secure a rubber exercise band or tube to an object, such as a table leg, that will stay still when the band is pulled. Secure the other end around your affected foot. 2. Sit on the floor, facing the object with your affected leg extended. The band or tube should be slightly tense when your foot is relaxed. 3. Slowly flex your affected ankle and toes to bring your foot toward you. 4. Hold this position for 3 seconds.  5. Slowly return your foot to the starting position, controlling the band as you do that. Do a total of 10 repetitions. Repeat 2 times. Complete this exercise 3 times per week. Exercise E: Plantar Flexors   1. Sit on the floor with your affected leg extended. 2. Loop a rubber exercise band or tube around the ball of your affected foot. The ball of your foot is on the walking surface, right under your toes. The band or tube should be slightly tense when your foot is relaxed. 3. Slowly point your toes downward, pushing them away from you. 4. Hold this position for 3 seconds. 5. Slowly release the tension in the band or tube, controlling smoothly until your foot is back in the starting position. Repeat for a total of 10 repetitions. Repeat 2 times. Complete this exercise 3 times per week. Exercise F: Towel Curls   1.  Sit in a chair on a non-carpeted surface, and put your feet on the floor. 2. Place a towel in front of your feet.  3. Keeping your heel on the floor, put your affected foot on the towel. 4. Pull the towel toward you by grabbing the towel with your toes and curling them under. Keep your heel on the floor. 5. Let your toes relax. 6. Grab the towel again. Keep going until the towel is completely underneath your foot. Repeat for a total of 10 repetitions. Repeat 2 times. Complete this exercise 3 times per week. Exercise G: Heel Raise ( Plantar Flexors, Standing)    1. Stand with your feet shoulder-width  apart. 2. Keep your weight spread evenly over the width of your feet while you rise up on your toes. Use a wall or table to steady yourself, but try not to use it for support. 3. If this exercise is too easy, try these options: ? Shift your weight toward your affected leg until you feel challenged. ? If told by your health care provider, lift your uninjured leg off the floor. 4. Hold this position for 3 seconds. Repeat for a total of 10 repetitions. Repeat 2 times. Complete this exercise 3 times per week. Exercise H: Tandem Walking 1. Stand with one foot directly in front of the other. 2. Slowly raise your back foot up, lifting your heel before your toes, and place it directly in front of your other foot. 3. Continue to walk in this heel-to-toe way for 10 steps or for as long as told by your health care provider. Have a countertop or wall nearby to use if needed to keep your balance, but try not to hold onto anything for support. Repeat 2 times. Complete this exercises 3 times per week. Make sure you discuss any questions you have with your health care provider. Document Released: 04/15/2005 Document Revised: 01/30/2016 Document Reviewed: 02/17/2015 Elsevier Interactive Patient Education  2018 [employer].

## 2018-12-28 NOTE — Progress Notes (Signed)
Chief Complaint  Patient presents with  . Follow-up    Subjective: Patient is a 48 y.o. female here for f/u thigh numbness.  She was given a pred burst that did not help. It is now constant.  She feels a combination of itching, burning, and numbness over the right anterior lateral thigh.  She did gain 12 pounds as her last visit.  Patient fell 2 weeks ago and twisted her right ankle.  She is still having pain and some swelling.  She is able to walk.  No bruising or weakness of the ankle that is unrelated to pain.  She has tried ibuprofen and Tylenol at home.  ROS: MSK: +R ankle pain Neuro: +tingling/numbness over R thigh  Past Medical History:  Diagnosis Date  . Anemia   . Essential hypertension 08/19/2016  . Morbid obesity ([place])   . Vaginal Pap smear, abnormal    colposcopy    Objective: BP 120/82 (BP Location: Left Arm, Patient Position: Sitting, Cuff Size: Large)   Pulse 84   Temp 98.6 F (37 C) (Oral)   Ht 5\' 5"  (1.651 m)   Wt 261 lb (118.4 kg)   SpO2 99%   BMI 43.43 kg/m  General: Awake, appears stated age HEENT: MMM, EOMi MSK: +TTP over navicular bone on R, no other bony ttp, neg squeeze, mild soft tissue edema noted, neg anterior drawer Neuro: Decreased sensation to pinprick over lat fem cut nerve distribution Lungs:  No accessory muscle use Psych: Age appropriate judgment and insight, normal affect and mood  Assessment and Plan: Thigh numbness - Plan: Ambulatory referral to Pain Clinic  Meralgia paresthetica of right side - Plan: Ambulatory referral to Pain Clinic, start gabapentin. Warned of drowsiness for AE.   Essential hypertension - Plan: amLODipine-valsartan (EXFORGE) 5-320 MG tablet  Acute right ankle pain - Plan: DG Ankle Complete Right- no fx's noted, await official read, stretches/exercises. If no improvement, will place her in boot and consider PT.   F/u in 5 mo to ck BP.  The patient voiced understanding and agreement to the plan.  [place], DO 12/28/18  4:43 PM

## 2019-01-11 ENCOUNTER — Other Ambulatory Visit: Payer: Self-pay

## 2019-01-11 ENCOUNTER — Encounter: Payer: Self-pay | Admitting: Obstetrics & Gynecology

## 2019-01-11 ENCOUNTER — Ambulatory Visit (INDEPENDENT_AMBULATORY_CARE_PROVIDER_SITE_OTHER): Payer: Commercial Managed Care - PPO | Admitting: Obstetrics & Gynecology

## 2019-01-11 VITALS — BP 127/68 | HR 67 | Ht 65.0 in | Wt 260.0 lb

## 2019-01-11 DIAGNOSIS — N939 Abnormal uterine and vaginal bleeding, unspecified: Secondary | ICD-10-CM

## 2019-01-11 MED ORDER — MEGESTROL ACETATE 40 MG PO TABS: 40.0000 mg | ORAL_TABLET | Freq: Two times a day (BID) | ORAL | 3 refills | Status: DC

## 2019-01-11 NOTE — Progress Notes (Signed)
History:  48 y.o. [id number] here today for f/u of AUB. Sx are improved on Megace but, she does not like taking the meds only when bleeding. She is     The following portions of the patient's history were reviewed and updated as appropriate: allergies, current medications, past family history, past medical history, past social history, past surgical history and problem list.  Review of Systems:  Pertinent items are noted in HPI.    Objective:  Physical Exam Blood pressure 127/68, pulse 67, height 5\' 5"  (1.651 m), weight 260 lb (117.9 kg), last menstrual period 01/05/2019.  CONSTITUTIONAL: Well-developed, well-nourished female in no acute distress.  HENT:  Normocephalic, atraumatic EYES: Conjunctivae and EOM are normal. No scleral icterus.  NECK: Normal range of motion SKIN: Skin is warm and dry. No rash noted. Not diaphoretic.No pallor. [place]: Alert and oriented to person, place, and time. Normal coordination.   Labs and Imaging Dg Ankle Complete Right  Result Date: 12/28/2018 CLINICAL DATA:  Fall 3 weeks prior, pain medially EXAM: RIGHT ANKLE - COMPLETE 3+ VIEW COMPARISON:  None. FINDINGS: Soft tissue thickening along the medial malleolus with trace right ankle joint effusion. No acute fracture or traumatic malalignment. He IMPRESSION: Medial soft tissue swelling and trace effusion without acute fracture or traumatic malalignment. Electronically Signed   By: Lovena Le M.D.   On: 12/28/2018 22:44   US Pelvis Transvaginal Non-ob (tv Only)  Result Date: 12/14/2018 CLINICAL DATA:  Initial evaluation for extended agile bleeding for 2 years. EXAM: TRANSABDOMINAL AND TRANSVAGINAL ULTRASOUND OF PELVIS TECHNIQUE: Both transabdominal and transvaginal ultrasound examinations of the pelvis were performed. Transabdominal technique was performed for global imaging of the pelvis including uterus, ovaries, adnexal regions, and pelvic cul-de-sac. It was necessary to proceed with endovaginal exam  following the transabdominal exam to visualize the uterus, endometrium, and ovaries. COMPARISON:  Prior CT from 10/23/2013. FINDINGS: Uterus Measurements: 8.1 x 5.5 x 6.1 cm = volume: 141.1 mL. 2.4 x 1.9 x 2.3 cm intramural fibroid present at the right anterior uterine body. Endometrium Thickness: 5 mm.  No focal abnormality visualized. Right ovary Measurements: 2.3 x 1.3 x 1.0 cm = volume: 1.5 mL. Normal appearance/no adnexal mass. Left ovary Measurements: 2.4 x 1.3 x 1.6 cm = volume: 2.5 mL. Normal appearance/no adnexal mass. Other findings No abnormal free fluid. IMPRESSION: 1. Endometrial stripe measures 5 mm in maximal thickness. If bleeding remains unresponsive to hormonal or medical therapy, sonohysterogram should be considered for focal lesion work-up. (Ref: Radiological Reasoning: Algorithmic Workup of Abnormal Vaginal Bleeding with Endovaginal Sonography and Sonohysterography. AJR 2008; 242:P53-61). 2. 2.4 cm intramural fibroid within the right uterine body. 3. Normal sonographic appearance of the ovaries. No adnexal mass or free fluid. Electronically Signed   By: Jeannine Boga M.D.   On: 12/14/2018 17:48    Assessment & Plan:  AUB- pt sx improved on Megace. Pt is taking the Megace only when she has irregular cycles. D/w pt how meds are taking and ways to improve sx.   Megace 40mg  bid for 1 month then  Decrease to Megace 40mg  daily F/u in 3 months or sooner prn  Andreya Lacks L. Harraway-Smith, M.D., [name]

## 2019-01-11 NOTE — Progress Notes (Signed)
Pt states bleeding has improved

## 2019-01-27 ENCOUNTER — Emergency Department (HOSPITAL_BASED_OUTPATIENT_CLINIC_OR_DEPARTMENT_OTHER)
Admission: EM | Admit: 2019-01-27 | Discharge: 2019-01-27 | Disposition: A | Discharge status: Home / Self Care | Payer: Commercial Managed Care - PPO | Attending: Emergency Medicine | Admitting: Emergency Medicine

## 2019-01-27 ENCOUNTER — Other Ambulatory Visit: Payer: Self-pay

## 2019-01-27 ENCOUNTER — Encounter (HOSPITAL_BASED_OUTPATIENT_CLINIC_OR_DEPARTMENT_OTHER): Payer: Self-pay

## 2019-01-27 DIAGNOSIS — I10 Essential (primary) hypertension: Secondary | ICD-10-CM | POA: Insufficient documentation

## 2019-01-27 DIAGNOSIS — Y999 Unspecified external cause status: Secondary | ICD-10-CM | POA: Diagnosis not present

## 2019-01-27 DIAGNOSIS — Z79899 Other long term (current) drug therapy: Secondary | ICD-10-CM | POA: Diagnosis not present

## 2019-01-27 DIAGNOSIS — S299XXA Unspecified injury of thorax, initial encounter: Secondary | ICD-10-CM | POA: Diagnosis present

## 2019-01-27 DIAGNOSIS — Y9389 Activity, other specified: Secondary | ICD-10-CM | POA: Insufficient documentation

## 2019-01-27 DIAGNOSIS — Y929 Unspecified place or not applicable: Secondary | ICD-10-CM | POA: Diagnosis not present

## 2019-01-27 DIAGNOSIS — X500XXA Overexertion from strenuous movement or load, initial encounter: Secondary | ICD-10-CM | POA: Insufficient documentation

## 2019-01-27 DIAGNOSIS — T148XXA Other injury of unspecified body region, initial encounter: Secondary | ICD-10-CM | POA: Insufficient documentation

## 2019-01-27 LAB — URINALYSIS, ROUTINE W REFLEX MICROSCOPIC
Bilirubin Urine: NEGATIVE
Glucose, UA: NEGATIVE mg/dL
Hgb urine dipstick: NEGATIVE
Ketones, ur: NEGATIVE mg/dL
Leukocytes,Ua: NEGATIVE
Nitrite: NEGATIVE
Protein, ur: NEGATIVE mg/dL
Specific Gravity, Urine: >1.03 — ABNORMAL HIGH (ref 1.005–1.030)
pH: 5.5 (ref 5.0–8.0)

## 2019-01-27 LAB — PREGNANCY, URINE: Preg Test, Ur: NEGATIVE

## 2019-01-27 MED ORDER — LIDOCAINE 5 % EX PTCH: 1.0000 | MEDICATED_PATCH | CUTANEOUS | 0 refills | Status: DC

## 2019-01-27 MED ORDER — KETOROLAC TROMETHAMINE 15 MG/ML IJ SOLN
15.0000 mg | Freq: Once | INTRAMUSCULAR | Status: AC
  Administered 2019-01-27: 15 mg via INTRAMUSCULAR
  Filled 2019-01-27: qty 1

## 2019-01-27 NOTE — ED Notes (Signed)
Pt reports back pain mid back, full ROM, began 2 days ago.

## 2019-01-27 NOTE — ED Triage Notes (Signed)
Pt c/o mid back x 2 days-denies injury-states pain is worse with movement and with deep breath-denies cough/flu like sx-NAD-steady gait

## 2019-01-27 NOTE — ED Provider Notes (Signed)
[facility] EMERGENCY DEPARTMENT Provider Note   CSN: [id number] Arrival date & time: 01/27/19  1654     History   Chief Complaint Chief Complaint  Patient presents with  . Back Pain    HPI Tiffany Peterson is a 48 y.o. female.     The history is provided by the patient and medical records. No language interpreter was used.  Back Pain  Tiffany Peterson is a 48 y.o. female  with a PMH as listed below who presents to the Emergency Department complaining of right upper back pain x 2 days.  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  Unsure if she had a specific injury versus overuse.  Pain is worse with certain movements.  She has tried taking Tylenol with little improvement. No neck pain, no fever, saddle anesthesia, weakness, numbness, urinary complaints including retention/incontinence. No history of cancer, IVDU, or recent spinal procedures.   Past Medical History:  Diagnosis Date  . Anemia   . Essential hypertension 08/19/2016  . Morbid obesity ([place])   . Vaginal Pap smear, abnormal    colposcopy    Patient Active Problem List   Diagnosis Date Noted  . Iron deficiency anemia due to chronic blood loss 12/07/2018  . Menorrhagia 12/07/2018  . Meralgia paraesthetica, right 12/01/2018  . Chest pain in adult 10/12/2018  . Anemia 10/11/2018  . Oropharyngeal dysphagia 10/03/2018  . Morbid obesity ([place])   . Hypersomnia 09/14/2016  . Sinus congestion 09/14/2016  . Insomnia 09/14/2016  . Essential hypertension 08/19/2016  . 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 Right ovarian cyst 11/13/2013    Past Surgical History:  Procedure Laterality Date  . TUBAL LIGATION       OB History    Gravida  4   Para  4   Term  4   Preterm      AB      Living  4     SAB      TAB      Ectopic      Multiple      Live Births  4            Home Medications    Prior to Admission medications   Medication  Sig Start Date End Date Taking? Authorizing Provider  amLODipine-valsartan (EXFORGE) 5-320 MG tablet Take 1 tablet by mouth daily. 12/28/18   Shelda Pal, DO  ferrous sulfate 325 (65 FE) MG EC tablet Take 325 mg by mouth 3 (three) times daily with meals.    [provider]  furosemide (LASIX) 20 MG tablet Take 1 tablet (20 mg total) by mouth daily. 10/13/18 01/11/19  Richardo Priest, MD  gabapentin (NEURONTIN) 400 MG capsule Take 1 capsule (400 mg total) by mouth 3 (three) times daily. 12/28/18   Shelda Pal, DO  lidocaine (LIDODERM) 5 % Place 1 patch onto the skin daily. Remove & Discard patch within 12 hours or as directed by MD 01/27/19   [place] Seaborn, Ozella Almond, PA-C  megestrol (MEGACE) 40 MG tablet Take 1 tablet (40 mg total) by mouth 2 (two) times daily. 01/11/19   [name], MD  metoprolol succinate (TOPROL-XL) 50 MG 24 hr tablet Take 1 tablet (50 mg total) by mouth daily. Take with or immediately following a meal. 10/18/18   Wendling, Crosby Oyster, DO  spironolactone (ALDACTONE) 25 MG tablet Take 1 tablet (25 mg total) by mouth daily.  10/13/18 01/11/19  Richardo Priest, MD  vitamin B-12 (CYANOCOBALAMIN) 1000 MCG tablet Take 1,000 mcg by mouth daily.    [provider]    Family History Family History  Problem Relation Age of Onset  . Stroke Maternal Aunt   . Diabetes Father   . Hypertension Father   . Diabetes Mother   . Hypertension Mother   . Cancer Neg Hx   . Heart attack Neg Hx   . Colon cancer Neg Hx   . Esophageal cancer Neg Hx     Social History Social History   Tobacco Use  . Smoking status: Never Smoker  . Smokeless tobacco: Never Used  Substance Use Topics  . Alcohol use: Yes    Comment: ocassionally  . Drug use: Never     Allergies   Patient has no known allergies.   Review of Systems Review of Systems  Musculoskeletal: Positive for back pain.  All other systems reviewed and are negative.    Physical Exam  Updated Vital Signs BP (!) 157/110 (BP Location: Right Arm)   Pulse 64   Temp 98.2 F (36.8 C) (Oral)   Resp 16   LMP 01/05/2019   SpO2 100%   Physical Exam Vitals signs and nursing note reviewed.  Constitutional:      General: She is not in acute distress.    Appearance: She is well-developed.  HENT:     Head: Normocephalic and atraumatic.  Neck:     Musculoskeletal: Neck supple.  Cardiovascular:     Rate and Rhythm: Normal rate and regular rhythm.     Heart sounds: Normal heart sounds. No murmur.  Pulmonary:     Effort: Pulmonary effort is normal. No respiratory distress.     Breath sounds: Normal breath sounds. No wheezing or rales.  Abdominal:     General: There is no distension.     Palpations: Abdomen is soft.     Tenderness: There is no abdominal tenderness.  Musculoskeletal: Normal range of motion.       Back:     Comments: No midline C/T/L-spine tenderness.  Tenderness to palpation of right rhomboid. Full ROM and 5/5 muscle strength x 4.   Skin:    General: Skin is warm and dry.  Neurological:     Mental Status: She is alert.     Comments: All 4 extremities neurovascularly intact.      ED Treatments / Results  Labs (all labs ordered are listed, but only abnormal results are displayed) Labs Reviewed  URINALYSIS, ROUTINE W REFLEX MICROSCOPIC - Abnormal; Notable for the following components:      Result Value   Specific Gravity, Urine >1.030 (*)    All other components within normal limits  PREGNANCY, URINE    EKG None  Radiology No results found.  Procedures Procedures (including critical care time)  Medications Ordered in ED Medications  ketorolac (TORADOL) 15 MG/ML injection 15 mg (15 mg Intramuscular Given 01/27/19 1917)     Initial Impression / Assessment and Plan / ED Course  I have reviewed the triage vital signs and the nursing notes.  Pertinent labs & imaging results that were available during my care of the patient were reviewed by  me and considered in my medical decision making (see chart for details).       Tiffany Peterson is a 48 y.o. 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 No midline tenderness. NVI  x4. Focal tenderness to rhomboid musculature. Will treat symptomatically and have patient follow up with PCP.  Home care instructions and return precautions discussed.  All questions answered.   Final Clinical Impressions(s) / ED Diagnoses   Final diagnoses:  Muscle strain    ED Discharge Orders         Ordered    lidocaine (LIDODERM) 5 %  Every 24 hours     01/27/19 1925           Yvonnia Tango, Ozella Almond, PA-C 01/27/19 1954    Drenda Freeze, MD 01/28/19 [phone]

## 2019-01-27 NOTE — Discharge Instructions (Signed)
It was my pleasure taking care of you today!   Lidoderm patch to area to help with pain. Alternate between Tylenol and Ibuprofen as needed for pain.   Call your primary care doctor on Monday to schedule a follow-up appointment.  Return to the emergency department for difficulty with your breathing, new or worsening symptoms, any additional concerns.

## 2019-03-06 ENCOUNTER — Telehealth: Payer: Self-pay

## 2019-03-06 NOTE — Telephone Encounter (Signed)
.   Patient called that after hours line due to abnormal uterine bleeding during lunch. Returned phone call and unable to leave message. Kathrene Alu RN

## 2019-03-06 NOTE — Telephone Encounter (Signed)
Patient states she has been spotting/bleeding since August 1st. Patient is taking Megace twice a day and bleeding has not stopped. Patient is frustrated that she has had some bleeding everyday for one month. Patient states that she is only having to use 1-2 pads a day but just upset because it has been going on so long.WIll route to provider for input.  Kathrene Alu RN

## 2019-03-08 ENCOUNTER — Encounter: Payer: Self-pay | Admitting: Obstetrics & Gynecology

## 2019-03-08 ENCOUNTER — Other Ambulatory Visit: Payer: Self-pay

## 2019-03-08 ENCOUNTER — Ambulatory Visit (INDEPENDENT_AMBULATORY_CARE_PROVIDER_SITE_OTHER): Payer: Commercial Managed Care - PPO | Admitting: Obstetrics & Gynecology

## 2019-03-08 DIAGNOSIS — N924 Excessive bleeding in the premenopausal period: Secondary | ICD-10-CM | POA: Diagnosis not present

## 2019-03-08 NOTE — Progress Notes (Signed)
TELEHEALTH GYNECOLOGY VIRTUAL VIDEO VISIT ENCOUNTER NOTE  Provider location: [facility] for [facility] [employer] at [facility]   I connected with Tiffany Peterson on 03/08/19 at  4:15 PM EDT by WebEx Encounter at home and verified that I am speaking with the correct person using two identifiers.   I discussed the limitations, risks, security and privacy concerns of performing an evaluation and management service virtually and the availability of in person appointments. I also discussed with the patient that there may be a patient responsible charge related to this service. The patient expressed understanding and agreed to proceed.   History:  Tiffany Peterson is a 48 y.o. [phone] female being evaluated today for AUB. Pt has been spotting since  02/03/2019. She wears tampons and need to change them 4x/day. She does not bleed heavily.  Pt is taking Megace bid. She denies any abnormal vaginal discharge, bleeding, pelvic pain or other concerns.       Past Medical History:  Diagnosis Date  . Anemia   . Essential hypertension 08/19/2016  . Morbid obesity ([place])   . Vaginal Pap smear, abnormal    colposcopy   Past Surgical History:  Procedure Laterality Date  . TUBAL LIGATION     The following portions of the patient's history were reviewed and updated as appropriate: allergies, current medications, past family history, past medical history, past social history, past surgical history and problem list.   Health Maintenance:  Normal pap and negative HRHPV on 12/07/2018.  Normal mammogram at work annually.    Review of Systems:  Pertinent items noted in HPI and remainder of comprehensive ROS otherwise negative.  Physical Exam:   General:  Alert, oriented and cooperative. Patient appears to be in no acute distress.  Mental Status: Normal mood and affect. Normal behavior. Normal judgment and thought content.   Respiratory: Normal respiratory effort, no problems with respiration noted   Rest of physical exam deferred due to type of encounter  Labs and Imaging 12/14/2018 CLINICAL DATA:  Initial evaluation for extended agile bleeding for 2 years.  EXAM: TRANSABDOMINAL AND TRANSVAGINAL ULTRASOUND OF PELVIS  TECHNIQUE: Both transabdominal and transvaginal ultrasound examinations of the pelvis were performed. Transabdominal technique was performed for global imaging of the pelvis including uterus, ovaries, adnexal regions, and pelvic cul-de-sac. It was necessary to proceed with endovaginal exam following the transabdominal exam to visualize the uterus, endometrium, and ovaries.  COMPARISON:  Prior CT from 10/23/2013.  FINDINGS: Uterus  Measurements: 8.1 x 5.5 x 6.1 cm = volume: 141.1 mL. 2.4 x 1.9 x 2.3 cm intramural fibroid present at the right anterior uterine body.  Endometrium  Thickness: 5 mm.  No focal abnormality visualized.  Right ovary  Measurements: 2.3 x 1.3 x 1.0 cm = volume: 1.5 mL. Normal appearance/no adnexal mass.  Left ovary  Measurements: 2.4 x 1.3 x 1.6 cm = volume: 2.5 mL. Normal appearance/no adnexal mass.  Other findings  No abnormal free fluid.  IMPRESSION: 1. Endometrial stripe measures 5 mm in maximal thickness. If bleeding remains unresponsive to hormonal or medical therapy, sonohysterogram should be considered for focal lesion work-up. (Ref: Radiological Reasoning: Algorithmic Workup of Abnormal Vaginal Bleeding with Endovaginal Sonography and Sonohysterography. AJR [id number]). 2. 2.4 cm intramural fibroid within the right uterine body. 3. Normal sonographic appearance of the ovaries. No adnexal mass or free fluid.   Assessment and Plan:     AUB. Perimenopausal unresponsive to Megace.   Needs endometrial bx  Megace 40mg  2 po bid.  I discussed the assessment and treatment plan with the patient. The patient was provided an opportunity to ask questions and all were answered. The patient agreed  with the plan and demonstrated an understanding of the instructions.   The patient was advised to call back or seek an in-person evaluation/go to the ED if the symptoms worsen or if the condition fails to improve as anticipated.  I provided 15 minutes of face-to-face time during this encounter.   [name], MD [facility], [place]

## 2019-03-08 NOTE — Telephone Encounter (Signed)
Patient will have virtual visit today. Kathrene Alu RN

## 2019-04-05 ENCOUNTER — Encounter: Payer: Self-pay | Admitting: Obstetrics & Gynecology

## 2019-04-05 ENCOUNTER — Ambulatory Visit (INDEPENDENT_AMBULATORY_CARE_PROVIDER_SITE_OTHER): Payer: Commercial Managed Care - PPO | Admitting: Obstetrics & Gynecology

## 2019-04-05 ENCOUNTER — Other Ambulatory Visit (HOSPITAL_COMMUNITY)
Admission: RE | Admit: 2019-04-05 | Discharge: 2019-04-05 | Disposition: A | Discharge status: Home / Self Care | Payer: Commercial Managed Care - PPO | Source: Ambulatory Visit | Attending: Obstetrics & Gynecology | Admitting: Obstetrics & Gynecology

## 2019-04-05 ENCOUNTER — Other Ambulatory Visit: Payer: Self-pay

## 2019-04-05 VITALS — BP 147/84 | HR 99 | Ht 65.0 in | Wt 269.0 lb

## 2019-04-05 DIAGNOSIS — Z113 Encounter for screening for infections with a predominantly sexual mode of transmission: Secondary | ICD-10-CM | POA: Insufficient documentation

## 2019-04-05 DIAGNOSIS — N924 Excessive bleeding in the premenopausal period: Secondary | ICD-10-CM

## 2019-04-05 DIAGNOSIS — Z30430 Encounter for insertion of intrauterine contraceptive device: Secondary | ICD-10-CM | POA: Diagnosis not present

## 2019-04-05 DIAGNOSIS — N921 Excessive and frequent menstruation with irregular cycle: Secondary | ICD-10-CM

## 2019-04-05 DIAGNOSIS — Z3202 Encounter for pregnancy test, result negative: Secondary | ICD-10-CM

## 2019-04-05 DIAGNOSIS — N939 Abnormal uterine and vaginal bleeding, unspecified: Secondary | ICD-10-CM | POA: Diagnosis not present

## 2019-04-05 LAB — POCT URINE PREGNANCY: Preg Test, Ur: NEGATIVE

## 2019-04-05 MED ORDER — LEVONORGESTREL 19.5 MCG/DAY IU IUD
INTRAUTERINE_SYSTEM | Freq: Once | INTRAUTERINE | Status: AC
  Administered 2019-04-05: 1 via INTRAUTERINE

## 2019-04-05 NOTE — Patient Instructions (Signed)
Intrauterine Device Insertion, Care After  This sheet gives you information about how to care for yourself after your procedure. Your health care provider may also give you more specific instructions. If you have problems or questions, contact your health care provider. What can I expect after the procedure? After the procedure, it is common to have:  Cramps and pain in the abdomen.  Light bleeding (spotting) or heavier bleeding that is like your menstrual period. This may last for up to a few days.  Lower back pain.  Dizziness.  Headaches.  Nausea. Follow these instructions at home:  Before resuming sexual activity, check to make sure that you can feel the IUD string(s). You should be able to feel the end of the string(s) below the opening of your cervix. If your IUD string is in place, you may resume sexual activity. ? If you had a hormonal IUD inserted more than 7 days after your most recent period started, you will need to use a backup method of birth control for 7 days after IUD insertion. Ask your health care provider whether this applies to you.  Continue to check that the IUD is still in place by feeling for the string(s) after every menstrual period, or once a month.  Take over-the-counter and prescription medicines only as told by your health care provider.  Do not drive or use heavy machinery while taking prescription pain medicine.  Keep all follow-up visits as told by your health care provider. This is important. Contact a health care provider if:  You have bleeding that is heavier or lasts longer than a normal menstrual cycle.  You have a fever.  You have cramps or abdominal pain that get worse or do not get better with medicine.  You develop abdominal pain that is new or is not in the same area of earlier cramping and pain.  You feel lightheaded or weak.  You have abnormal or bad-smelling discharge from your vagina.  You have pain during sexual activity.   You have any of the following problems with your IUD string(s): ? The string bothers or hurts you or your sexual partner. ? You cannot feel the string. ? The string has gotten longer.  You can feel the IUD in your vagina.  You think you may be pregnant, or you miss your menstrual period.  You think you may have an STI (sexually transmitted infection). Get help right away if:  You have flu-like symptoms.  You have a fever and chills.  You can feel that your IUD has slipped out of place. Summary  After the procedure, it is common to have cramps and pain in the abdomen. It is also common to have light bleeding (spotting) or heavier bleeding that is like your menstrual period.  Continue to check that the IUD is still in place by feeling for the string(s) after every menstrual period, or once a month.  Keep all follow-up visits as told by your health care provider. This is important.  Contact your health care provider if you have problems with your IUD string(s), such as the string getting longer or bothering you or your sexual partner. This information is not intended to replace advice given to you by your health care provider. Make sure you discuss any questions you have with your health care provider. Document Released: 01/28/2011 Document Revised: 05/14/2017 Document Reviewed: 04/22/2016 Elsevier Patient Education  2020 Elsevier Inc. Levonorgestrel intrauterine device (IUD) What is this medicine? LEVONORGESTREL IUD (LEE voe nor jes   trel) is a contraceptive (birth control) device. The device is placed inside the uterus by a healthcare professional. It is used to prevent pregnancy. This device can also be used to treat heavy bleeding that occurs during your period. This medicine may be used for other purposes; ask your health care provider or pharmacist if you have questions. COMMON BRAND NAME(S): Kyleena, LILETTA, Mirena, Skyla What should I tell my health care provider before I take  this medicine? They need to know if you have any of these conditions:  abnormal Pap smear  cancer of the breast, uterus, or cervix  diabetes  endometritis  genital or pelvic infection now or in the past  have more than one sexual partner or your partner has more than one partner  heart disease  history of an ectopic or tubal pregnancy  immune system problems  IUD in place  liver disease or tumor  problems with blood clots or take blood-thinners  seizures  use intravenous drugs  uterus of unusual shape  vaginal bleeding that has not been explained  an unusual or allergic reaction to levonorgestrel, other hormones, silicone, or polyethylene, medicines, foods, dyes, or preservatives  pregnant or trying to get pregnant  breast-feeding How should I use this medicine? This device is placed inside the uterus by a health care professional. Talk to your pediatrician regarding the use of this medicine in children. Special care may be needed. Overdosage: If you think you have taken too much of this medicine contact a poison control center or emergency room at once. NOTE: This medicine is only for you. Do not share this medicine with others. What if I miss a dose? This does not apply. Depending on the brand of device you have inserted, the device will need to be replaced every 3 to 6 years if you wish to continue using this type of birth control. What may interact with this medicine? Do not take this medicine with any of the following medications:  amprenavir  bosentan  fosamprenavir This medicine may also interact with the following medications:  aprepitant  armodafinil  barbiturate medicines for inducing sleep or treating seizures  bexarotene  boceprevir  griseofulvin  medicines to treat seizures like carbamazepine, ethotoin, felbamate, oxcarbazepine, phenytoin, topiramate  modafinil  pioglitazone  rifabutin  rifampin  rifapentine  some medicines  to treat HIV infection like atazanavir, efavirenz, indinavir, lopinavir, nelfinavir, tipranavir, ritonavir  St. John's wort  warfarin This list may not describe all possible interactions. Give your health care provider a list of all the medicines, herbs, non-prescription drugs, or dietary supplements you use. Also tell them if you smoke, drink alcohol, or use illegal drugs. Some items may interact with your medicine. What should I watch for while using this medicine? Visit your doctor or health care professional for regular check ups. See your doctor if you or your partner has sexual contact with others, becomes HIV positive, or gets a sexual transmitted disease. This product does not protect you against HIV infection (AIDS) or other sexually transmitted diseases. You can check the placement of the IUD yourself by reaching up to the top of your vagina with clean fingers to feel the threads. Do not pull on the threads. It is a good habit to check placement after each menstrual period. Call your doctor right away if you feel more of the IUD than just the threads or if you cannot feel the threads at all. The IUD may come out by itself. You may become pregnant if   the device comes out. If you notice that the IUD has come out use a backup birth control method like condoms and call your health care provider. Using tampons will not change the position of the IUD and are okay to use during your period. This IUD can be safely scanned with magnetic resonance imaging (MRI) only under specific conditions. Before you have an MRI, tell your healthcare provider that you have an IUD in place, and which type of IUD you have in place. What side effects may I notice from receiving this medicine? Side effects that you should report to your doctor or health care professional as soon as possible:  allergic reactions like skin rash, itching or hives, swelling of the face, lips, or tongue  fever, flu-like symptoms   genital sores  high blood pressure  no menstrual period for 6 weeks during use  pain, swelling, warmth in the leg  pelvic pain or tenderness  severe or sudden headache  signs of pregnancy  stomach cramping  sudden shortness of breath  trouble with balance, talking, or walking  unusual vaginal bleeding, discharge  yellowing of the eyes or skin Side effects that usually do not require medical attention (report to your doctor or health care professional if they continue or are bothersome):  acne  breast pain  change in sex drive or performance  changes in weight  cramping, dizziness, or faintness while the device is being inserted  headache  irregular menstrual bleeding within first 3 to 6 months of use  nausea This list may not describe all possible side effects. Call your doctor for medical advice about side effects. You may report side effects to FDA at 1-800-FDA-1088. Where should I keep my medicine? This does not apply. NOTE: This sheet is a summary. It may not cover all possible information. If you have questions about this medicine, talk to your doctor, pharmacist, or health care provider.  2020 Elsevier/Gold Standard (2018-04-12 13:22:01)  

## 2019-04-05 NOTE — Progress Notes (Signed)
Tiffany Peterson is a 48 y.o. [id number] here for evaluation and management if perimenopausel bleeding.   Pt with perimenopausal bleeding. She has had intermittently heavy bleeding followed by 2-3 months of no bleeding for ~2  years. For the past 3 months pt has had daily bleeding. Her US showed a fibroid uterus with a 5 mm endometrial stripe. Pt is here for a Endo biopsy. After the biopsy was completed, pt opted for a LnIUD. This was done as a separate procedure.     12/14/2018 CLINICAL DATA:  Initial evaluation for extended agile bleeding for 2 years.  EXAM: TRANSABDOMINAL AND TRANSVAGINAL ULTRASOUND OF PELVIS  TECHNIQUE: Both transabdominal and transvaginal ultrasound examinations of the pelvis were performed. Transabdominal technique was performed for global imaging of the pelvis including uterus, ovaries, adnexal regions, and pelvic cul-de-sac. It was necessary to proceed with endovaginal exam following the transabdominal exam to visualize the uterus, endometrium, and ovaries.  COMPARISON:  Prior CT from 10/23/2013.  FINDINGS: Uterus  Measurements: 8.1 x 5.5 x 6.1 cm = volume: 141.1 mL. 2.4 x 1.9 x 2.3 cm intramural fibroid present at the right anterior uterine body.  Endometrium  Thickness: 5 mm.  No focal abnormality visualized.  Right ovary  Measurements: 2.3 x 1.3 x 1.0 cm = volume: 1.5 mL. Normal appearance/no adnexal mass.  Left ovary  Measurements: 2.4 x 1.3 x 1.6 cm = volume: 2.5 mL. Normal appearance/no adnexal mass.  Other findings  No abnormal free fluid.  IMPRESSION: 1. Endometrial stripe measures 5 mm in maximal thickness. If bleeding remains unresponsive to hormonal or medical therapy, sonohysterogram should be considered for focal lesion work-up. (Ref: Radiological Reasoning: Algorithmic Workup of Abnormal Vaginal Bleeding with Endovaginal Sonography and Sonohysterography. AJR [id number]). 2. 2.4 cm intramural fibroid within the  right uterine body. 3. Normal sonographic appearance of the ovaries. No adnexal mass or free fluid.  GYNECOLOGY CLINIC PROCEDURE NOTE  Endometrial bioppsy The indications for endometrial biopsy were reviewed.   Risks of the biopsy including cramping, bleeding, infection, uterine perforation, inadequate specimen and need for additional procedures  were discussed. The patient states she understands and agrees to undergo procedure today. Consent was signed. Time out was performed. Urine HCG was negative. A sterile speculum was placed in the patient's vagina and the cervix was prepped with Betadine. A single-toothed tenaculum was placed on the anterior lip of the cervix to stabilize it. The 3 mm pipelle was introduced into the endometrial cavity without difficulty to a depth of 13cm, and a moderate amount of tissue was obtained and sent to pathology. The instruments were removed from the patient's vagina. Minimal bleeding from the cervix was noted. The patient tolerated the procedure well.   IUD Insertion Procedure Note Patient identified, informed consent performed.  Discussed risks of irregular bleeding, cramping, infection, malpositioning or misplacement of the IUD outside the uterus which may require further procedures. Time out was performed.  Urine pregnancy test negative.  Speculum placed in the vagina.  Cervix visualized.  Cleaned with Betadine x 2.  Grasped anteriorly with a single tooth tenaculum.  Uterus sounded to 13 cm. Liletta IUD placed per manufacturer's recommendations.  Strings trimmed to 3-4 cm. Tenaculum was removed, good hemostasis noted.  Patient tolerated procedure well.   Patient was given post-procedure instructions.  Patient was asked to follow up in 4 weeks for IUD check.  F/u surg path D/c Megace after 2 weeks  Tiffany Peterson, M.D., Tiffany Peterson

## 2019-04-05 NOTE — Progress Notes (Signed)
Patient reports bleeding daily for the last three months. Kathrene Alu RN

## 2019-04-07 LAB — CERVICOVAGINAL ANCILLARY ONLY
Chlamydia: NEGATIVE
Comment: NEGATIVE
Comment: NEGATIVE
Comment: NORMAL
Neisseria Gonorrhea: NEGATIVE
Trichomonas: NEGATIVE

## 2019-04-07 LAB — SURGICAL PATHOLOGY

## 2019-05-04 ENCOUNTER — Encounter: Payer: Self-pay | Admitting: Obstetrics & Gynecology

## 2019-05-04 ENCOUNTER — Ambulatory Visit (INDEPENDENT_AMBULATORY_CARE_PROVIDER_SITE_OTHER): Payer: Commercial Managed Care - PPO | Admitting: Obstetrics & Gynecology

## 2019-05-04 ENCOUNTER — Other Ambulatory Visit: Payer: Self-pay

## 2019-05-04 VITALS — Ht 65.0 in | Wt 268.0 lb

## 2019-05-04 DIAGNOSIS — N924 Excessive bleeding in the premenopausal period: Secondary | ICD-10-CM

## 2019-05-04 DIAGNOSIS — Z30431 Encounter for routine checking of intrauterine contraceptive device: Secondary | ICD-10-CM | POA: Diagnosis not present

## 2019-05-04 NOTE — Progress Notes (Signed)
  GYNECOLOGY OFFICE ENCOUNTER NOTE  History:  48 y.o. [id number] here today for today for IUD string check; Liletta  IUD was placed 04/05/2019 for perimenopausal.  Pt reports continued spotting which is very light at present.   The following portions of the patient's history were reviewed and updated as appropriate: allergies, current medications, past family history, past medical history, past social history, past surgical history and problem list. Last pap smear on 12/07/2018 was normal, negative HRHPV.  Review of Systems:  Pertinent items are noted in HPI.   Objective:  Physical Exam Height 5\' 5"  (1.651 m), weight 268 lb (121.6 kg). CONSTITUTIONAL: Well-developed, well-nourished female in no acute distress.  HENT:  Normocephalic, atraumatic. External right and left ear normal. Oropharynx is clear and moist EYES: Conjunctivae and EOM are normal. Pupils are equal, round, and reactive to light. No scleral icterus.  NECK: Normal range of motion, supple, no masses CARDIOVASCULAR: Normal heart rate noted RESPIRATORY: Effort and breath sounds normal, no problems with respiration noted ABDOMEN: Soft, no distention noted.   PELVIC: Normal appearing external genitalia; normal appearing vaginal mucosa and cervix.  IUD strings visualized, about 6 cm in length outside cervix. Strings trimmed to 3 cm.  04/05/2019 A. ENDOMETRIUM, BIOPSY:  - Inactive endometrium with hormone effect.  - No hyperplasia or malignancy.    GROSS DESCRIPTION:   Received in formalin are tan, hemorrhagic soft tissue fragments that are  entirely submitted.  Volume: 1.1 x 1 x 0.2 cm. (1 B) SSW 04/06/2019   Assessment & Plan:  Patient to keep IUD in place for up to seven years Rec f/u in 1 year for annual or sooner prn .    Tiffany Homewood L. Harraway-Smith, MD, [place], [facility] for [employer], [place]

## 2019-05-04 NOTE — Patient Instructions (Signed)
Levonorgestrel intrauterine device (IUD) What is this medicine? LEVONORGESTREL IUD (LEE voe nor jes trel) is a contraceptive (birth control) device. The device is placed inside the uterus by a healthcare professional. It is used to prevent pregnancy. This device can also be used to treat heavy bleeding that occurs during your period. This medicine may be used for other purposes; ask your health care provider or pharmacist if you have questions. COMMON BRAND NAME(S): Kyleena, LILETTA, Mirena, Skyla What should I tell my health care provider before I take this medicine? They need to know if you have any of these conditions:  abnormal Pap smear  cancer of the breast, uterus, or cervix  diabetes  endometritis  genital or pelvic infection now or in the past  have more than one sexual partner or your partner has more than one partner  heart disease  history of an ectopic or tubal pregnancy  immune system problems  IUD in place  liver disease or tumor  problems with blood clots or take blood-thinners  seizures  use intravenous drugs  uterus of unusual shape  vaginal bleeding that has not been explained  an unusual or allergic reaction to levonorgestrel, other hormones, silicone, or polyethylene, medicines, foods, dyes, or preservatives  pregnant or trying to get pregnant  breast-feeding How should I use this medicine? This device is placed inside the uterus by a health care professional. Talk to your pediatrician regarding the use of this medicine in children. Special care may be needed. Overdosage: If you think you have taken too much of this medicine contact a poison control center or emergency room at once. NOTE: This medicine is only for you. Do not share this medicine with others. What if I miss a dose? This does not apply. Depending on the brand of device you have inserted, the device will need to be replaced every 3 to 6 years if you wish to continue using this type  of birth control. What may interact with this medicine? Do not take this medicine with any of the following medications:  amprenavir  bosentan  fosamprenavir This medicine may also interact with the following medications:  aprepitant  armodafinil  barbiturate medicines for inducing sleep or treating seizures  bexarotene  boceprevir  griseofulvin  medicines to treat seizures like carbamazepine, ethotoin, felbamate, oxcarbazepine, phenytoin, topiramate  modafinil  pioglitazone  rifabutin  rifampin  rifapentine  some medicines to treat HIV infection like atazanavir, efavirenz, indinavir, lopinavir, nelfinavir, tipranavir, ritonavir  St. John's wort  warfarin This list may not describe all possible interactions. Give your health care provider a list of all the medicines, herbs, non-prescription drugs, or dietary supplements you use. Also tell them if you smoke, drink alcohol, or use illegal drugs. Some items may interact with your medicine. What should I watch for while using this medicine? Visit your doctor or health care professional for regular check ups. See your doctor if you or your partner has sexual contact with others, becomes HIV positive, or gets a sexual transmitted disease. This product does not protect you against HIV infection (AIDS) or other sexually transmitted diseases. You can check the placement of the IUD yourself by reaching up to the top of your vagina with clean fingers to feel the threads. Do not pull on the threads. It is a good habit to check placement after each menstrual period. Call your doctor right away if you feel more of the IUD than just the threads or if you cannot feel the threads at   all. The IUD may come out by itself. You may become pregnant if the device comes out. If you notice that the IUD has come out use a backup birth control method like condoms and call your health care provider. Using tampons will not change the position of the  IUD and are okay to use during your period. This IUD can be safely scanned with magnetic resonance imaging (MRI) only under specific conditions. Before you have an MRI, tell your healthcare provider that you have an IUD in place, and which type of IUD you have in place. What side effects may I notice from receiving this medicine? Side effects that you should report to your doctor or health care professional as soon as possible:  allergic reactions like skin rash, itching or hives, swelling of the face, lips, or tongue  fever, flu-like symptoms  genital sores  high blood pressure  no menstrual period for 6 weeks during use  pain, swelling, warmth in the leg  pelvic pain or tenderness  severe or sudden headache  signs of pregnancy  stomach cramping  sudden shortness of breath  trouble with balance, talking, or walking  unusual vaginal bleeding, discharge  yellowing of the eyes or skin Side effects that usually do not require medical attention (report to your doctor or health care professional if they continue or are bothersome):  acne  breast pain  change in sex drive or performance  changes in weight  cramping, dizziness, or faintness while the device is being inserted  headache  irregular menstrual bleeding within first 3 to 6 months of use  nausea This list may not describe all possible side effects. Call your doctor for medical advice about side effects. You may report side effects to FDA at 1-800-FDA-1088. Where should I keep my medicine? This does not apply. NOTE: This sheet is a summary. It may not cover all possible information. If you have questions about this medicine, talk to your doctor, pharmacist, or health care provider.  2020 Elsevier/Gold Standard (2018-04-12 13:22:01)  

## 2019-05-04 NOTE — Progress Notes (Signed)
Patient states she is still bleeding daily with insertion of IUD. Follow up after EMB. Kathrene Alu RN

## 2019-05-30 ENCOUNTER — Other Ambulatory Visit: Payer: Self-pay

## 2019-05-31 ENCOUNTER — Other Ambulatory Visit: Payer: Self-pay

## 2019-05-31 ENCOUNTER — Ambulatory Visit (INDEPENDENT_AMBULATORY_CARE_PROVIDER_SITE_OTHER): Payer: Commercial Managed Care - PPO | Admitting: Family Medicine

## 2019-05-31 ENCOUNTER — Encounter: Payer: Self-pay | Admitting: Family Medicine

## 2019-05-31 DIAGNOSIS — I10 Essential (primary) hypertension: Secondary | ICD-10-CM | POA: Diagnosis not present

## 2019-05-31 DIAGNOSIS — Z0000 Encounter for general adult medical examination without abnormal findings: Secondary | ICD-10-CM | POA: Diagnosis not present

## 2019-05-31 DIAGNOSIS — Z114 Encounter for screening for human immunodeficiency virus [HIV]: Secondary | ICD-10-CM

## 2019-05-31 MED ORDER — AMLODIPINE BESYLATE 5 MG PO TABS: 5.0000 mg | ORAL_TABLET | Freq: Every day | ORAL | 3 refills | Status: DC

## 2019-05-31 MED ORDER — LISINOPRIL 20 MG PO TABS: 20.0000 mg | ORAL_TABLET | Freq: Every day | ORAL | 3 refills | Status: DC

## 2019-05-31 MED ORDER — METOPROLOL SUCCINATE ER 50 MG PO TB24: 50.0000 mg | ORAL_TABLET | Freq: Every day | ORAL | 3 refills | Status: DC

## 2019-05-31 MED ORDER — SPIRONOLACTONE 25 MG PO TABS: 25.0000 mg | ORAL_TABLET | Freq: Every day | ORAL | 3 refills | Status: DC

## 2019-05-31 MED ORDER — NORGESTIMATE-ETH ESTRADIOL 0.25-35 MG-MCG PO TABS: ORAL_TABLET | ORAL | 2 refills | Status: DC

## 2019-05-31 NOTE — Progress Notes (Signed)
Chief Complaint  Patient presents with  . Follow-up     Well Woman Tiffany Peterson is here for a complete physical.  Due to COVID-19 pandemic, we are interacting via web portal for an electronic face-to-face visit. I verified patient's ID using 2 identifiers. Patient agreed to proceed with visit via this method. Patient is at home, I am at office. Patient and I are present for visit.  Her last physical was >1 year ago.  Current diet: in general, diet is not good. Current exercise: walking. Weight is increased and she denies daytime fatigue. Seatbelt? Yes  Health Maintenance Pap/HPV- Yes Mammogram- 1 yr Colon cancer screening-Yes Shingrix- No Tetanus- Yes HIV screening- No  Past Medical History:  Diagnosis Date  . Anemia   . Essential hypertension 08/19/2016  . Morbid obesity ([place])   . Vaginal Pap smear, abnormal    colposcopy     Past Surgical History:  Procedure Laterality Date  . TUBAL LIGATION      Medications  Current Outpatient Medications on File Prior to Visit  Medication Sig Dispense Refill  . ferrous sulfate 325 (65 FE) MG EC tablet Take 325 mg by mouth 3 (three) times daily with meals.    . gabapentin (NEURONTIN) 400 MG capsule Take 1 capsule (400 mg total) by mouth 3 (three) times daily. 90 capsule 3  . lidocaine (LIDODERM) 5 % Place 1 patch onto the skin daily. Remove & Discard patch within 12 hours or as directed by MD 30 patch 0  . vitamin B-12 (CYANOCOBALAMIN) 1000 MCG tablet Take 1,000 mcg by mouth daily.    . furosemide (LASIX) 20 MG tablet Take 1 tablet (20 mg total) by mouth daily. 90 tablet 1    Allergies No Known Allergies  Review of Systems: Constitutional:  no unexpected weight changes Eye:  no recent significant change in vision Ear/Nose/Mouth/Throat:  Ears:  no recent change in hearing Nose/Mouth/Throat:  no complaints of nasal congestion, no sore throat Cardiovascular: no chest pain Respiratory:  no shortness of breath Gastrointestinal:   no abdominal pain, no change in bowel habits GU:  Female: negative for dysuria or pelvic pain; +vag bleeding Musculoskeletal/Extremities:  no pain of the joints Integumentary (Skin/Breast):  no abnormal skin lesions reported Neurologic:  no headaches Endocrine:  denies fatigue Hematologic/Lymphatic:  No areas of easy bleeding  Exam No conversational dyspnea Age appropriate judgment and insight Nml affect and mood  Assessment and Plan  Well adult exam - Plan: CBC, Comp Met (CMET), Lipid Profile  Essential hypertension - Plan: metoprolol succinate (TOPROL-XL) 50 MG 24 hr tablet  Screening for HIV (human immunodeficiency virus) - Plan: HIV antibody (with reflex)   Well 48 y.o. female. Counseled on diet and exercise. Other orders as above. Add estrogen for 10 d as she is on IUD.  Stretches/exercises for foot pain. She is going to see someone at her job regarding this as it is free. Shingrix info given. Follow up in 6 weeks to ck htn. The patient voiced understanding and agreement to the plan.  [place], DO 05/31/19 3:15 PM

## 2019-05-31 NOTE — Patient Instructions (Signed)
The new Shingrix vaccine (for shingles) is a 2 shot series. It can make people feel low energy, achy and almost like they have the flu for 48 hours after injection. Please plan accordingly when deciding on when to get this shot. Call our office for a nurse visit appointment to get this. The second shot of the series is less severe regarding the side effects, but it still lasts 48 hours.   Keep the diet clean and stay active.  Give [place] 2-3 business days to get the results of your labs back.   Plantar Fasciitis Stretches/exercises Do exercises exactly as told by your health care provider and adjust them as directed. It is normal to feel mild stretching, pulling, tightness, or discomfort as you do these exercises, but you should stop right away if you feel sudden pain or your pain gets worse.   Stretching and range of motion exercises These exercises warm up your muscles and joints and improve the movement and flexibility of your foot. These exercises also help to relieve pain.  Exercise A: Plantar fascia stretch 1. Sit with your left / right leg crossed over your opposite knee. 2. Hold your heel with one hand with that thumb near your arch. With your other hand, hold your toes and gently pull them back toward the top of your foot. You should feel a stretch on the bottom of your toes or your foot or both. 3. Hold this stretch for 30 seconds. 4. Slowly release your toes and return to the starting position. Repeat 2 times. Complete this exercise 3 times per week.  Exercise B: Gastroc, standing 1. Stand with your hands against a wall. 2. Extend your left / right leg behind you, and bend your front knee slightly. 3. Keeping your heels on the floor and keeping your back knee straight, shift your weight toward the wall without arching your back. You should feel a gentle stretch in your left / right calf. 4. Hold this position for 30 seconds. Repeat 2 times. Complete this exercise 3 times a  week. Exercise C: Soleus, standing 1. Stand with your hands against a wall. 2. Extend your left / right leg behind you, and bend your front knee slightly. 3. Keeping your heels on the floor, bend your back knee and slightly shift your weight over the back leg. You should feel a gentle stretch deep in your calf. 4. Hold this position for 30 seconds. Repeat 2 times. Complete this exercise 3 times per week. Exercise D: Gastrocsoleus, standing 1. Stand with the ball of your left / right foot on a step. The ball of your foot is on the walking surface, right under your toes. 2. Keep your other foot firmly on the same step. 3. Hold onto the wall or a railing for balance. 4. Slowly lift your other foot, allowing your body weight to press your heel down over the edge of the step. You should feel a stretch in your left / right calf. 5. Hold this position for 30 seconds. 6. Return both feet to the step. 7. Repeat this exercise with a slight bend in your left / right knee. Repeat 2 times with your left / right knee straight and 2times with your left / right knee bent. Complete this exercise 3 times a week.  Balance exercise This exercise builds your balance and strength control of your arch to help take pressure off your plantar fascia. Exercise E: Single leg stand 1. Without shoes, stand near a railing  or in a doorway. You may hold onto the railing or door frame as needed. 2. Stand on your left / right foot. Keep your big toe down on the floor and try to keep your arch lifted. Do not let your foot roll inward. 3. Hold this position for 30 seconds. 4. If this exercise is too easy, you can try it with your eyes closed or while standing on a pillow. Repeat 2 times. Complete this exercise 3 times per week. This information is not intended to replace advice given to you by your health care provider. Make sure you discuss any questions you have with your health care provider. Document Released: 06/01/2005  Document Revised: 02/04/2016 Document Reviewed: 04/15/2015 Elsevier Interactive Patient Education  2017 [employer].

## 2020-02-08 ENCOUNTER — Telehealth: Payer: Self-pay

## 2020-02-08 ENCOUNTER — Other Ambulatory Visit: Payer: Self-pay

## 2020-02-08 DIAGNOSIS — N939 Abnormal uterine and vaginal bleeding, unspecified: Secondary | ICD-10-CM

## 2020-02-08 MED ORDER — MEGESTROL ACETATE 40 MG PO TABS: ORAL_TABLET | ORAL | 0 refills | Status: DC

## 2020-02-08 NOTE — Telephone Encounter (Signed)
Pt called stating she has had an IUD since 12/03/19. Pt states she is having irregular bleeding and is requesting a refill of Megace. Message was sent to the provider. Jurnie Garritano l Jaedin Regina, CMA

## 2020-03-06 ENCOUNTER — Ambulatory Visit: Payer: Commercial Managed Care - PPO | Admitting: Family Medicine

## 2020-03-06 ENCOUNTER — Encounter: Payer: Self-pay | Admitting: Family Medicine

## 2020-03-06 NOTE — Progress Notes (Signed)
Patient did not keep appointment today. She may call to reschedule.  

## 2020-05-08 ENCOUNTER — Encounter: Payer: Self-pay | Admitting: Family Medicine

## 2020-05-08 ENCOUNTER — Ambulatory Visit (INDEPENDENT_AMBULATORY_CARE_PROVIDER_SITE_OTHER): Payer: Commercial Managed Care - PPO | Admitting: Family Medicine

## 2020-05-08 ENCOUNTER — Other Ambulatory Visit: Payer: Self-pay

## 2020-05-08 ENCOUNTER — Other Ambulatory Visit (HOSPITAL_COMMUNITY)
Admission: RE | Admit: 2020-05-08 | Discharge: 2020-05-08 | Disposition: A | Discharge status: Home / Self Care | Payer: Commercial Managed Care - PPO | Source: Ambulatory Visit | Attending: Family Medicine | Admitting: Family Medicine

## 2020-05-08 DIAGNOSIS — N939 Abnormal uterine and vaginal bleeding, unspecified: Secondary | ICD-10-CM | POA: Insufficient documentation

## 2020-05-08 DIAGNOSIS — N921 Excessive and frequent menstruation with irregular cycle: Secondary | ICD-10-CM

## 2020-05-08 DIAGNOSIS — I10 Essential (primary) hypertension: Secondary | ICD-10-CM

## 2020-05-08 MED ORDER — DOXYCYCLINE HYCLATE 100 MG PO CAPS: 100.0000 mg | ORAL_CAPSULE | Freq: Two times a day (BID) | ORAL | 0 refills | Status: DC

## 2020-05-08 MED ORDER — LISINOPRIL 20 MG PO TABS: 20.0000 mg | ORAL_TABLET | Freq: Every day | ORAL | 3 refills | Status: DC

## 2020-05-08 MED ORDER — MEGESTROL ACETATE 40 MG PO TABS: ORAL_TABLET | ORAL | 2 refills | Status: DC

## 2020-05-08 MED ORDER — SPIRONOLACTONE 25 MG PO TABS: 25.0000 mg | ORAL_TABLET | Freq: Every day | ORAL | 3 refills | Status: DC

## 2020-05-08 MED ORDER — METOPROLOL SUCCINATE ER 50 MG PO TB24: 50.0000 mg | ORAL_TABLET | Freq: Every day | ORAL | 3 refills | Status: DC

## 2020-05-08 MED ORDER — AMLODIPINE BESYLATE 5 MG PO TABS: 5.0000 mg | ORAL_TABLET | Freq: Every day | ORAL | 3 refills | Status: DC

## 2020-05-08 NOTE — Assessment & Plan Note (Signed)
Reports foul smelling discharge, check GC/Chlam--treat for presumptive chronic endometritis. Would not resample her due to in-dwelling LnIUD with negative EMB just before placement. Use megace with cycles if continues to be an issue. Offered HTA--could consider hysterectomy. She is s/p BTL with SVD x 3.

## 2020-05-08 NOTE — Assessment & Plan Note (Signed)
Refilled her meds.

## 2020-05-08 NOTE — Progress Notes (Signed)
   Subjective:    Patient ID: Tiffany Peterson is a 49 y.o. female presenting with No chief complaint on file.  on 05/08/2020  HPI: Here for bleeding x 2 wks. Still bleeding with negative EMB since 11/20. Seemed like it worked better the first month or two. Now with increasing bleeding and having the bleeding.  Review of Systems  Constitutional: Negative for chills and fever.  Respiratory: Negative for shortness of breath.   Cardiovascular: Negative for chest pain.  Gastrointestinal: Negative for abdominal pain, nausea and vomiting.  Genitourinary: Negative for dysuria.  Skin: Negative for rash.      Objective:    BP (!) 156/94   Pulse 67   Wt 278 lb (126.1 kg)   BMI 46.26 kg/m  Physical Exam Constitutional:      General: She is not in acute distress.    Appearance: She is well-developed.  HENT:     Head: Normocephalic and atraumatic.  Eyes:     General: No scleral icterus. Cardiovascular:     Rate and Rhythm: Normal rate.  Pulmonary:     Effort: Pulmonary effort is normal.  Abdominal:     Palpations: Abdomen is soft.  Genitourinary:    Comments: BUS normal, vagina is pink and rugated, cervix is parous without lesion IUD strings noted, uterus is small and anteverted, no adnexal mass or tenderness.  Musculoskeletal:     Cervical back: Neck supple.  Skin:    General: Skin is warm and dry.  Neurological:     Mental Status: She is alert and oriented to person, place, and time.         Assessment & Plan:   Problem List Items Addressed This Visit      Unprioritized   Essential hypertension    Refilled her meds.      Relevant Medications   lisinopril (ZESTRIL) 20 MG tablet   metoprolol succinate (TOPROL-XL) 50 MG 24 hr tablet   amLODipine (NORVASC) 5 MG tablet   spironolactone (ALDACTONE) 25 MG tablet   Menorrhagia    Reports foul smelling discharge, check GC/Chlam--treat for presumptive chronic endometritis. Would not resample her due to in-dwelling LnIUD  with negative EMB just before placement. Use megace with cycles if continues to be an issue. Offered HTA--could consider hysterectomy. She is s/p BTL with SVD x 3.       Other Visit Diagnoses    Abnormal uterine bleeding       Relevant Medications   megestrol (MEGACE) 40 MG tablet   doxycycline (VIBRAMYCIN) 100 MG capsule   Other Relevant Orders   GC/Chlamydia probe amp ([place])not at [facility]      Total time in review of prior notes, pathology, labs, history taking, review with patient, exam, note writing, discussion of options, plan for next steps, alternatives and risks of treatment: 31 minutes.  Return in about 3 months (around 08/08/2020).  Donnamae Jude 05/08/2020 4:41 PM

## 2020-05-14 LAB — GC/CHLAMYDIA PROBE AMP (~~LOC~~) NOT AT ARMC
Chlamydia: NEGATIVE
Comment: NEGATIVE
Comment: NORMAL
Neisseria Gonorrhea: NEGATIVE

## 2020-06-02 ENCOUNTER — Other Ambulatory Visit: Payer: Self-pay

## 2020-06-02 ENCOUNTER — Emergency Department (HOSPITAL_BASED_OUTPATIENT_CLINIC_OR_DEPARTMENT_OTHER)
Admission: EM | Admit: 2020-06-02 | Discharge: 2020-06-02 | Disposition: A | Discharge status: Home / Self Care | Payer: Commercial Managed Care - PPO | Attending: Emergency Medicine | Admitting: Emergency Medicine

## 2020-06-02 ENCOUNTER — Encounter (HOSPITAL_BASED_OUTPATIENT_CLINIC_OR_DEPARTMENT_OTHER): Payer: Self-pay | Admitting: *Deleted

## 2020-06-02 DIAGNOSIS — Z79899 Other long term (current) drug therapy: Secondary | ICD-10-CM | POA: Insufficient documentation

## 2020-06-02 DIAGNOSIS — I10 Essential (primary) hypertension: Secondary | ICD-10-CM | POA: Diagnosis not present

## 2020-06-02 DIAGNOSIS — H9202 Otalgia, left ear: Secondary | ICD-10-CM | POA: Diagnosis present

## 2020-06-02 DIAGNOSIS — R519 Headache, unspecified: Secondary | ICD-10-CM | POA: Insufficient documentation

## 2020-06-02 DIAGNOSIS — H6122 Impacted cerumen, left ear: Secondary | ICD-10-CM | POA: Insufficient documentation

## 2020-06-02 NOTE — ED Triage Notes (Signed)
Pt report decreased hearing in right hear and hearing loss in left ear. States she attempted to flush wax from her ears today without success. Wax noted in left ear canal

## 2020-06-02 NOTE — ED Provider Notes (Signed)
[facility] Provider Note   CSN: [id number] Arrival date & time: 06/02/20  1802     History Chief Complaint  Patient presents with   Hearing Problem    Tiffany Peterson is a 49 y.o. female.  Patient is a 49 year old female who presents with decreased hearing out of her left ear. She says been going on for a few days. It was actually in both of her ears but she cleaned some wax out of her right ear and that feels better. She still having some dull hearing in her left ear. No pain. No URI symptoms. No fevers. No dizziness. She has some chronic headaches but they are unchanged from her baseline.        Past Medical History:  Diagnosis Date   Anemia    Essential hypertension 08/19/2016   Morbid obesity ([place])    Vaginal Pap smear, abnormal    colposcopy    Patient Active Problem List   Diagnosis Date Noted   Iron deficiency anemia due to chronic blood loss 12/07/2018   Menorrhagia 12/07/2018   Meralgia paraesthetica, right 12/01/2018   Chest pain in adult 10/12/2018   Anemia 10/11/2018   Oropharyngeal dysphagia 10/03/2018   Morbid obesity ([place])    Hypersomnia 09/14/2016   Sinus congestion 09/14/2016   Insomnia 09/14/2016   Essential hypertension 08/19/2016   Mild dysplasia of cervix ([facility]) 12/11/2013   Right ovarian cyst 11/13/2013    Past Surgical History:  Procedure Laterality Date   TUBAL LIGATION       OB History    Gravida  4   Para  4   Term  4   Preterm      AB      Living  4     SAB      IAB      Ectopic      Multiple      Live Births  4           Family History  Problem Relation Age of Onset   Stroke Maternal Aunt    Diabetes Father    Hypertension Father    Diabetes Mother    Hypertension Mother    Cancer Neg Hx    Heart attack Neg Hx    Colon cancer Neg Hx    Esophageal cancer Neg Hx     Social History   Tobacco Use   Smoking status: Never Smoker    Smokeless tobacco: Never Used  Vaping Use   Vaping Use: Never used  Substance Use Topics   Alcohol use: Yes    Comment: ocassionally   Drug use: Never    Home Medications Prior to Admission medications   Medication Sig Start Date End Date Taking? Authorizing Provider  amLODipine (NORVASC) 5 MG tablet Take 1 tablet (5 mg total) by mouth daily. 05/08/20   Donnamae Jude, MD  doxycycline (VIBRAMYCIN) 100 MG capsule Take 1 capsule (100 mg total) by mouth 2 (two) times daily. 05/08/20   Donnamae Jude, MD  ferrous sulfate 325 (65 FE) MG EC tablet Take 325 mg by mouth 3 (three) times daily with meals. Patient not taking: Reported on 05/08/2020    [provider]  furosemide (LASIX) 20 MG tablet Take 1 tablet (20 mg total) by mouth daily. 10/13/18 01/11/19  Richardo Priest, MD  gabapentin (NEURONTIN) 400 MG capsule Take 1 capsule (400 mg total) by mouth 3 (three) times daily. Patient not taking: Reported on 05/08/2020  12/28/18   Shelda Pal, DO  lidocaine (LIDODERM) 5 % Place 1 patch onto the skin daily. Remove & Discard patch within 12 hours or as directed by MD Patient not taking: Reported on 05/08/2020 01/27/19   Ward, Ozella Almond, PA-C  lisinopril (ZESTRIL) 20 MG tablet Take 1 tablet (20 mg total) by mouth daily. 05/08/20   Donnamae Jude, MD  megestrol (MEGACE) 40 MG tablet Take 40 mg twice daily for 10 days 05/08/20   Donnamae Jude, MD  metoprolol succinate (TOPROL-XL) 50 MG 24 hr tablet Take 1 tablet (50 mg total) by mouth daily. Take with or immediately following a meal. 05/08/20   Donnamae Jude, MD  spironolactone (ALDACTONE) 25 MG tablet Take 1 tablet (25 mg total) by mouth daily. 05/08/20   Donnamae Jude, MD  vitamin B-12 (CYANOCOBALAMIN) 1000 MCG tablet Take 1,000 mcg by mouth daily. Patient not taking: Reported on 05/08/2020    [provider]    Allergies    Patient has no known allergies.  Review of Systems   Review of Systems   Constitutional: Negative for fatigue.  HENT: Negative for congestion and rhinorrhea.        Wax in ears with hearing loss  Eyes: Negative.   Respiratory: Negative for shortness of breath.   Gastrointestinal: Negative for nausea and vomiting.  Musculoskeletal: Negative for neck pain.  Neurological: Negative for dizziness and headaches.  Psychiatric/Behavioral: Negative for confusion.  All other systems reviewed and are negative.   Physical Exam Updated Vital Signs BP (!) 179/92 (BP Location: Right Wrist)    Pulse 67    Temp 98.7 F (37.1 C) (Oral)    Resp 20    Ht 5\' 5"  (1.651 m)    Wt 129.3 kg    SpO2 100%    BMI 47.43 kg/m   Physical Exam Constitutional:      Appearance: She is well-developed and well-nourished.  HENT:     Head: Normocephalic and atraumatic.     Ears:     Comments: Right ear canal is normal-appearing. TM appears normal. Left ear has a cerumen impaction    Nose: Nose normal.     Mouth/Throat:     Mouth: Mucous membranes are moist.  Eyes:     Pupils: Pupils are equal, round, and reactive to light.  Cardiovascular:     Rate and Rhythm: Normal rate.  Pulmonary:     Effort: Pulmonary effort is normal. No respiratory distress.     Breath sounds: No wheezing.  Abdominal:     Tenderness: There is no abdominal tenderness.  Musculoskeletal:        General: No edema. Normal range of motion.     Cervical back: Normal range of motion and neck supple.  Lymphadenopathy:     Cervical: No cervical adenopathy.  Skin:    General: Skin is warm and dry.     Findings: No rash.  Neurological:     Mental Status: She is alert and oriented to person, place, and time.  Psychiatric:        Mood and Affect: Mood and affect normal.     ED Results / Procedures / Treatments   Labs (all labs ordered are listed, but only abnormal results are displayed) Labs Reviewed - No data to display  EKG None  Radiology No results found.  Procedures Procedures (including critical  care time)  Medications Ordered in ED Medications - No data to display  ED Course  I  have reviewed the triage vital signs and the nursing notes.  Pertinent labs & imaging results that were available during my care of the patient were reviewed by me and considered in my medical decision making (see chart for details).    MDM Rules/Calculators/A&P                          Patient presents with a cerumen impaction.  Her ears were flushed and this has resolved the problem.  Recheck on her ears shows clear canals.  She says she feels much better and her hearing is better.  She was discharged home in good condition.  She was encouraged to follow-up with her PCP as needed.  Final Clinical Impression(s) / ED Diagnoses Final diagnoses:  Impacted cerumen of left ear    Rx / DC Orders ED Discharge Orders    None       Malvin Johns, MD 06/02/20 2244

## 2020-06-05 ENCOUNTER — Telehealth: Payer: Self-pay

## 2020-06-05 ENCOUNTER — Other Ambulatory Visit: Payer: Self-pay | Admitting: Family Medicine

## 2020-06-05 DIAGNOSIS — N939 Abnormal uterine and vaginal bleeding, unspecified: Secondary | ICD-10-CM

## 2020-06-05 MED ORDER — MEGESTROL ACETATE 40 MG PO TABS: 80.0000 mg | ORAL_TABLET | Freq: Two times a day (BID) | ORAL | 2 refills | Status: DC

## 2020-06-05 NOTE — Telephone Encounter (Signed)
Pt called stating she needs another Rx for Megace. She states she has been taking 80 mg of Megace twice daily and is unable to refill her medication because it is too early. Pt states that the Megace is not helping and she is still spotting. I offered for the pt to come in to see a doctor to discuss other options but the pt refused. A message was sent to the provider.  Kelani Robart l Audrena Talaga, CMA

## 2020-07-25 ENCOUNTER — Encounter: Payer: Self-pay | Admitting: Obstetrics & Gynecology

## 2020-07-25 ENCOUNTER — Other Ambulatory Visit: Payer: Self-pay

## 2020-07-25 ENCOUNTER — Ambulatory Visit (INDEPENDENT_AMBULATORY_CARE_PROVIDER_SITE_OTHER): Payer: Commercial Managed Care - PPO | Admitting: Obstetrics & Gynecology

## 2020-07-25 VITALS — BP 178/106 | HR 74 | Ht 64.0 in | Wt 291.0 lb

## 2020-07-25 DIAGNOSIS — R030 Elevated blood-pressure reading, without diagnosis of hypertension: Secondary | ICD-10-CM | POA: Diagnosis not present

## 2020-07-25 DIAGNOSIS — N924 Excessive bleeding in the premenopausal period: Secondary | ICD-10-CM

## 2020-07-25 DIAGNOSIS — N939 Abnormal uterine and vaginal bleeding, unspecified: Secondary | ICD-10-CM | POA: Diagnosis not present

## 2020-07-25 NOTE — Progress Notes (Signed)
Patient states irregular bleeding has not changed in pattern. Tiffany Alu RN

## 2020-07-25 NOTE — Progress Notes (Signed)
History:  50 y.o. [id number] here today for eval of cont'd AUB. Pt has a LnIUD and she is still on Megace. She is taking Megace 2 tabs bid. She cont to reports bleeding daily. She had stopped bleeding with the IUD and thought that she could stop the Megace but, once she stopped it she started to bleed daily and now it is heavy.     The following portions of the patient's history were reviewed and updated as appropriate: allergies, current medications, past family history, past medical history, past social history, past surgical history and problem list.  Review of Systems:  Pertinent items are noted in HPI.    Objective:  Physical Exam Blood pressure (!) 178/106, pulse 74, height 5\' 4"  (1.626 m), weight 291 lb (132 kg).  CONSTITUTIONAL: Well-developed, well-nourished female in no acute distress.  HENT:  Normocephalic, atraumatic EYES: Conjunctivae and EOM are normal. No scleral icterus.  NECK: Normal range of motion SKIN: Skin is warm and dry. No rash noted. Not diaphoretic.No pallor. [place]: Alert and oriented to person, place, and time. Normal coordination.  Abd: Soft, nontender and nondistended Pelvic: Normal appearing external genitalia; normal appearing vaginal mucosa and cervix.  Normal discharge.  Small uterus (actual size difficult to access due to body habitus). No palpable masses, no uterine or adnexal tenderness  Labs and Imaging 12/14/2018 CLINICAL DATA:  Initial evaluation for extended agile bleeding for 2 years.  EXAM: TRANSABDOMINAL AND TRANSVAGINAL ULTRASOUND OF PELVIS  TECHNIQUE: Both transabdominal and transvaginal ultrasound examinations of the pelvis were performed. Transabdominal technique was performed for global imaging of the pelvis including uterus, ovaries, adnexal regions, and pelvic cul-de-sac. It was necessary to proceed with endovaginal exam following the transabdominal exam to visualize the uterus, endometrium, and ovaries.  COMPARISON:  Prior CT  from 10/23/2013.  FINDINGS: Uterus  Measurements: 8.1 x 5.5 x 6.1 cm = volume: 141.1 mL. 2.4 x 1.9 x 2.3 cm intramural fibroid present at the right anterior uterine body.  Endometrium  Thickness: 5 mm.  No focal abnormality visualized.  Right ovary  Measurements: 2.3 x 1.3 x 1.0 cm = volume: 1.5 mL. Normal appearance/no adnexal mass.  Left ovary  Measurements: 2.4 x 1.3 x 1.6 cm = volume: 2.5 mL. Normal appearance/no adnexal mass.  Other findings  No abnormal free fluid.  IMPRESSION: 1. Endometrial stripe measures 5 mm in maximal thickness. If bleeding remains unresponsive to hormonal or medical therapy, sonohysterogram should be considered for focal lesion work-up. (Ref: Radiological Reasoning: Algorithmic Workup of Abnormal Vaginal Bleeding with Endovaginal Sonography and Sonohysterography. AJR 2008; 124:P80-99). 2. 2.4 cm intramural fibroid within the right uterine body. 3. Normal sonographic appearance of the ovaries. No adnexal mass or free fluid.  Assessment & Plan:  AUB/perimenopausal bleeding- pt wants definitive management. She was instructed about endometrial ablation on the last visit and she is interested in this.  Patient desires surgical management with hysteroscopy with D&C and endometrial ablation.  The risks of surgery were discussed in detail with the patient including but not limited to: bleeding which may require transfusion or reoperation; infection which may require prolonged hospitalization or re-hospitalization and antibiotic therapy; injury to bowel, bladder, ureters and major vessels or other surrounding organs; need for additional procedures including laparotomy; thromboembolic phenomenon, incisional problems and other postoperative or anesthesia complications.  Patient was told that the likelihood that her condition and symptoms will be treated effectively with this surgical management was very high; the postoperative expectations were also  discussed in detail. The patient  also understands the alternative treatment options which were discussed in full. All questions were answered.  She was told that she will be contacted by our surgical scheduler regarding the time and date of her surgery; routine preoperative instructions of having nothing to eat or drink after midnight on the day prior to surgery and also coming to the hospital 1 1/2 hours prior to her time of surgery were also emphasized.  She was told she may be called for a preoperative appointment about a week prior to surgery and will be given further preoperative instructions at that visit. Printed patient education handouts about the procedure were given to the patient to review at home.   Elevated blood pressure.   Pt is now being compliant with her meds.   She will f/u with primary care to have her BPs rechecked.   Total face-to-face time with patient was 25 min.  Greater than 50% was spent in counseling and coordination of care with the patient.   Gaston Dase L. Harraway-Smith, M.D., [name]

## 2020-08-10 ENCOUNTER — Other Ambulatory Visit (HOSPITAL_COMMUNITY)
Admission: RE | Admit: 2020-08-10 | Discharge: 2020-08-10 | Disposition: A | Discharge status: Home / Self Care | Payer: Commercial Managed Care - PPO | Source: Ambulatory Visit | Attending: Obstetrics & Gynecology | Admitting: Obstetrics & Gynecology

## 2020-08-10 DIAGNOSIS — Z01812 Encounter for preprocedural laboratory examination: Secondary | ICD-10-CM | POA: Insufficient documentation

## 2020-08-10 DIAGNOSIS — Z20822 Contact with and (suspected) exposure to covid-19: Secondary | ICD-10-CM | POA: Diagnosis not present

## 2020-08-10 LAB — SARS CORONAVIRUS 2 (TAT 6-24 HRS): SARS Coronavirus 2: NEGATIVE

## 2020-08-10 NOTE — Progress Notes (Signed)
Prior to the pt being tested for covid, she stated that she tested + for covid on 07/01/20, but she has no proof. Pt advised that she will need to be tested and it will be the decision of the ordering provider if the procedure will take place based on the results.

## 2020-08-12 ENCOUNTER — Encounter (HOSPITAL_COMMUNITY): Payer: Self-pay | Admitting: Obstetrics & Gynecology

## 2020-08-12 NOTE — Progress Notes (Signed)
Spoke with Tiffany Peterson for pre-op call. Tiffany Peterson denies cardiac history or Diabetes. Tiffany Peterson is treated for HTN. She was seen by Dr. Bettina Gavia in 2020 for a follow up on chest pain. He found her to be anemic. Echo done and stated in his note "hypertension is controlled and I do not feel a compelling need to do an ischemia evaluation". Tiffany Peterson denies any recent chest pain or shortness of breath.  Covid test done 08/10/20 and it's negative.  Tiffany Peterson states she's been in quarantine since the test was done and understands that she stays in quarantine until she comes to the hospital tomorrow.

## 2020-08-12 NOTE — Anesthesia Preprocedure Evaluation (Addendum)
Anesthesia Evaluation  Patient identified by MRN, date of birth, ID band Patient awake    Reviewed: Allergy & Precautions, NPO status , Patient's Chart, lab work & pertinent test results  Airway Mallampati: II  TM Distance: >3 FB Neck ROM: Full    Dental  (+) Dental Advisory Given   Pulmonary neg pulmonary ROS,    breath sounds clear to auscultation       Cardiovascular hypertension, Pt. on medications and Pt. on home beta blockers  Rhythm:Regular Rate:Normal  10/2018 Echo: Normal EF. Valves WNL   Neuro/Psych negative neurological ROS     GI/Hepatic negative GI ROS, Neg liver ROS,   Endo/Other  Morbid obesity  Renal/GU negative Renal ROS     Musculoskeletal   Abdominal   Peds  Hematology  (+) anemia ,   Anesthesia Other Findings   Reproductive/Obstetrics                            Lab Results  Component Value Date   WBC 7.2 12/01/2018   HGB 11.0 (L) 12/01/2018   HCT 34.7 12/01/2018   MCV 75 (L) 12/01/2018   PLT 315 12/01/2018   Lab Results  Component Value Date   CREATININE 1.23 (H) 11/14/2018   BUN 21 11/14/2018   NA 136 11/14/2018   K 4.4 11/14/2018   CL 99 11/14/2018   CO2 27 11/14/2018    Anesthesia Physical Anesthesia Plan  ASA: III  Anesthesia Plan: General   Post-op Pain Management:    Induction: Intravenous  PONV Risk Score and Plan: 3 and Ondansetron, Dexamethasone, Midazolam and Treatment may vary due to age or medical condition  Airway Management Planned: LMA  Additional Equipment:   Intra-op Plan:   Post-operative Plan: Extubation in OR  Informed Consent: I have reviewed the patients History and Physical, chart, labs and discussed the procedure including the risks, benefits and alternatives for the proposed anesthesia with the patient or authorized representative who has indicated his/her understanding and acceptance.     Dental advisory  given  Plan Discussed with: CRNA  Anesthesia Plan Comments:        Anesthesia Quick Evaluation

## 2020-08-13 ENCOUNTER — Other Ambulatory Visit: Payer: Self-pay

## 2020-08-13 ENCOUNTER — Ambulatory Visit (HOSPITAL_COMMUNITY): Payer: Commercial Managed Care - PPO | Admitting: Anesthesiology

## 2020-08-13 ENCOUNTER — Encounter (HOSPITAL_COMMUNITY)
Admission: RE | Disposition: A | Discharge status: Home / Self Care | Payer: Self-pay | Source: Home / Self Care | Attending: Obstetrics & Gynecology

## 2020-08-13 ENCOUNTER — Encounter (HOSPITAL_COMMUNITY): Payer: Self-pay | Admitting: Obstetrics & Gynecology

## 2020-08-13 ENCOUNTER — Telehealth: Payer: Self-pay | Admitting: General Practice

## 2020-08-13 ENCOUNTER — Ambulatory Visit (HOSPITAL_COMMUNITY)
Admission: RE | Admit: 2020-08-13 | Discharge: 2020-08-13 | Disposition: A | Discharge status: Home / Self Care | Payer: Commercial Managed Care - PPO | Attending: Obstetrics & Gynecology | Admitting: Obstetrics & Gynecology

## 2020-08-13 DIAGNOSIS — N939 Abnormal uterine and vaginal bleeding, unspecified: Secondary | ICD-10-CM | POA: Insufficient documentation

## 2020-08-13 DIAGNOSIS — Z30432 Encounter for removal of intrauterine contraceptive device: Secondary | ICD-10-CM

## 2020-08-13 DIAGNOSIS — Z79899 Other long term (current) drug therapy: Secondary | ICD-10-CM | POA: Diagnosis not present

## 2020-08-13 DIAGNOSIS — N920 Excessive and frequent menstruation with regular cycle: Secondary | ICD-10-CM | POA: Diagnosis present

## 2020-08-13 DIAGNOSIS — D500 Iron deficiency anemia secondary to blood loss (chronic): Secondary | ICD-10-CM | POA: Diagnosis present

## 2020-08-13 DIAGNOSIS — Z975 Presence of (intrauterine) contraceptive device: Secondary | ICD-10-CM | POA: Insufficient documentation

## 2020-08-13 DIAGNOSIS — N921 Excessive and frequent menstruation with irregular cycle: Secondary | ICD-10-CM

## 2020-08-13 HISTORY — PX: DILITATION & CURRETTAGE/HYSTROSCOPY WITH NOVASURE ABLATION: SHX5568

## 2020-08-13 HISTORY — PX: IUD REMOVAL: SHX5392

## 2020-08-13 LAB — POCT PREGNANCY, URINE: Preg Test, Ur: NEGATIVE

## 2020-08-13 LAB — BASIC METABOLIC PANEL
Anion gap: 9 (ref 5–15)
BUN: 16 mg/dL (ref 6–20)
CO2: 24 mmol/L (ref 22–32)
Calcium: 9.3 mg/dL (ref 8.9–10.3)
Chloride: 104 mmol/L (ref 98–111)
Creatinine, Ser: 1.13 mg/dL — ABNORMAL HIGH (ref 0.44–1.00)
GFR, Estimated: 59 mL/min — ABNORMAL LOW (ref >60)
Glucose, Bld: 102 mg/dL — ABNORMAL HIGH (ref 70–99)
Potassium: 4.4 mmol/L (ref 3.5–5.1)
Sodium: 137 mmol/L (ref 135–145)

## 2020-08-13 LAB — CBC
HCT: 42.8 % (ref 36.0–46.0)
Hemoglobin: 13.4 g/dL (ref 12.0–15.0)
MCH: 25.8 pg — ABNORMAL LOW (ref 26.0–34.0)
MCHC: 31.3 g/dL (ref 30.0–36.0)
MCV: 82.5 fL (ref 80.0–100.0)
Platelets: 324 10*3/uL (ref 150–400)
RBC: 5.19 MIL/uL — ABNORMAL HIGH (ref 3.87–5.11)
RDW: 13.7 % (ref 11.5–15.5)
WBC: 6.7 10*3/uL (ref 4.0–10.5)
nRBC: 0 % (ref 0.0–0.2)

## 2020-08-13 SURGERY — DILATATION & CURETTAGE/HYSTEROSCOPY WITH NOVASURE ABLATION
Anesthesia: General | Site: Uterus

## 2020-08-13 MED ORDER — ONDANSETRON HCL 4 MG/2ML IJ SOLN
INTRAMUSCULAR | Status: DC | PRN
  Administered 2020-08-13: 4 mg via INTRAVENOUS

## 2020-08-13 MED ORDER — ONDANSETRON HCL 4 MG/2ML IJ SOLN
INTRAMUSCULAR | Status: AC
  Filled 2020-08-13: qty 4

## 2020-08-13 MED ORDER — LIDOCAINE 2% (20 MG/ML) 5 ML SYRINGE
INTRAMUSCULAR | Status: AC
  Filled 2020-08-13: qty 10

## 2020-08-13 MED ORDER — LIDOCAINE 2% (20 MG/ML) 5 ML SYRINGE
INTRAMUSCULAR | Status: DC | PRN
  Administered 2020-08-13: 60 mg via INTRAVENOUS

## 2020-08-13 MED ORDER — FENTANYL CITRATE (PF) 100 MCG/2ML IJ SOLN
25.0000 ug | INTRAMUSCULAR | Status: DC | PRN
  Administered 2020-08-13: 25 ug via INTRAVENOUS

## 2020-08-13 MED ORDER — IODINE STRONG (LUGOLS) 5 % PO SOLN
ORAL | Status: AC
  Filled 2020-08-13: qty 1

## 2020-08-13 MED ORDER — KETOROLAC TROMETHAMINE 30 MG/ML IJ SOLN
INTRAMUSCULAR | Status: AC
  Filled 2020-08-13: qty 2

## 2020-08-13 MED ORDER — BUPIVACAINE HCL (PF) 0.5 % IJ SOLN
INTRAMUSCULAR | Status: DC | PRN
  Administered 2020-08-13: 20 mL

## 2020-08-13 MED ORDER — MIDAZOLAM HCL 2 MG/2ML IJ SOLN
INTRAMUSCULAR | Status: AC
  Filled 2020-08-13: qty 2

## 2020-08-13 MED ORDER — FERRIC SUBSULFATE 259 MG/GM EX SOLN
CUTANEOUS | Status: AC
  Filled 2020-08-13: qty 8

## 2020-08-13 MED ORDER — DEXAMETHASONE SODIUM PHOSPHATE 10 MG/ML IJ SOLN
INTRAMUSCULAR | Status: AC
  Filled 2020-08-13: qty 1

## 2020-08-13 MED ORDER — KETOROLAC TROMETHAMINE 30 MG/ML IJ SOLN
INTRAMUSCULAR | Status: DC | PRN
  Administered 2020-08-13: 30 mg via INTRAVENOUS

## 2020-08-13 MED ORDER — PHENYLEPHRINE 40 MCG/ML (10ML) SYRINGE FOR IV PUSH (FOR BLOOD PRESSURE SUPPORT)
PREFILLED_SYRINGE | INTRAVENOUS | Status: AC
  Filled 2020-08-13: qty 10

## 2020-08-13 MED ORDER — FENTANYL CITRATE (PF) 250 MCG/5ML IJ SOLN
INTRAMUSCULAR | Status: AC
  Filled 2020-08-13: qty 5

## 2020-08-13 MED ORDER — PROPOFOL 10 MG/ML IV BOLUS
INTRAVENOUS | Status: AC
  Filled 2020-08-13: qty 20

## 2020-08-13 MED ORDER — PROPOFOL 10 MG/ML IV BOLUS
INTRAVENOUS | Status: DC | PRN
  Administered 2020-08-13: 200 mg via INTRAVENOUS

## 2020-08-13 MED ORDER — BUPIVACAINE HCL (PF) 0.25 % IJ SOLN
INTRAMUSCULAR | Status: AC
  Filled 2020-08-13: qty 60

## 2020-08-13 MED ORDER — MIDAZOLAM HCL 5 MG/5ML IJ SOLN
INTRAMUSCULAR | Status: DC | PRN
  Administered 2020-08-13: 2 mg via INTRAVENOUS

## 2020-08-13 MED ORDER — SILVER NITRATE-POT NITRATE 75-25 % EX MISC
CUTANEOUS | Status: AC
  Filled 2020-08-13: qty 10

## 2020-08-13 MED ORDER — LACTATED RINGERS IV SOLN: INTRAVENOUS | Status: DC

## 2020-08-13 MED ORDER — SOD CITRATE-CITRIC ACID 500-334 MG/5ML PO SOLN
30.0000 mL | ORAL | Status: AC
  Administered 2020-08-13: 30 mL via ORAL
  Filled 2020-08-13: qty 15

## 2020-08-13 MED ORDER — CHLORHEXIDINE GLUCONATE 0.12 % MT SOLN
15.0000 mL | Freq: Once | OROMUCOSAL | Status: AC
  Administered 2020-08-13: 15 mL via OROMUCOSAL
  Filled 2020-08-13: qty 15

## 2020-08-13 MED ORDER — FENTANYL CITRATE (PF) 100 MCG/2ML IJ SOLN
INTRAMUSCULAR | Status: DC | PRN
  Administered 2020-08-13 (×2): 50 ug via INTRAVENOUS

## 2020-08-13 MED ORDER — ACETAMINOPHEN 500 MG PO TABS
1000.0000 mg | ORAL_TABLET | Freq: Once | ORAL | Status: AC
  Administered 2020-08-13: 1000 mg via ORAL
  Filled 2020-08-13: qty 2

## 2020-08-13 MED ORDER — BUPIVACAINE HCL 0.5 % IJ SOLN
INTRAMUSCULAR | Status: AC
  Filled 2020-08-13: qty 2

## 2020-08-13 MED ORDER — DEXAMETHASONE SODIUM PHOSPHATE 10 MG/ML IJ SOLN
INTRAMUSCULAR | Status: DC | PRN
  Administered 2020-08-13: 10 mg via INTRAVENOUS

## 2020-08-13 MED ORDER — AMISULPRIDE (ANTIEMETIC) 5 MG/2ML IV SOLN: 10.0000 mg | Freq: Once | INTRAVENOUS | Status: DC | PRN

## 2020-08-13 MED ORDER — FENTANYL CITRATE (PF) 100 MCG/2ML IJ SOLN
INTRAMUSCULAR | Status: AC
  Filled 2020-08-13: qty 2

## 2020-08-13 MED ORDER — POVIDONE-IODINE 10 % EX SWAB: 2.0000 "application " | Freq: Once | CUTANEOUS | Status: DC

## 2020-08-13 MED ORDER — ORAL CARE MOUTH RINSE: 15.0000 mL | Freq: Once | OROMUCOSAL | Status: AC

## 2020-08-13 MED ORDER — SODIUM CHLORIDE 0.9 % IR SOLN
Status: DC | PRN
  Administered 2020-08-13: 3000 mL

## 2020-08-13 SURGICAL SUPPLY — 13 items
ABLATOR SURESOUND NOVASURE (ABLATOR) ×3 IMPLANT
CATH ROBINSON RED A/P 16FR (CATHETERS) ×3 IMPLANT
GLOVE BIO SURGEON STRL SZ7 (GLOVE) ×3 IMPLANT
GLOVE SURG UNDER POLY LF SZ7 (GLOVE) ×6 IMPLANT
GOWN STRL REUS W/ TWL LRG LVL3 (GOWN DISPOSABLE) ×2 IMPLANT
GOWN STRL REUS W/ TWL XL LVL3 (GOWN DISPOSABLE) ×2 IMPLANT
GOWN STRL REUS W/TWL LRG LVL3 (GOWN DISPOSABLE) ×3
GOWN STRL REUS W/TWL XL LVL3 (GOWN DISPOSABLE) ×3
KIT PROCEDURE FLUENT (KITS) ×3 IMPLANT
KIT TURNOVER KIT B (KITS) ×3 IMPLANT
PACK VAGINAL MINOR WOMEN LF (CUSTOM PROCEDURE TRAY) ×3 IMPLANT
PAD OB MATERNITY 4.3X12.25 (PERSONAL CARE ITEMS) ×3 IMPLANT
TOWEL GREEN STERILE FF (TOWEL DISPOSABLE) ×6 IMPLANT

## 2020-08-13 NOTE — Telephone Encounter (Signed)
Left message on VM informing patient of Post op appt on 09/11/2020 at 1:45pm.  Asked patient to give our office a call with any questions or concerns.  Mychart message sent as well.

## 2020-08-13 NOTE — Op Note (Signed)
08/13/2020  10:00 AM  PATIENT:  Tiffany Peterson  50 y.o. female  PRE-OPERATIVE DIAGNOSIS:  AUB  POST-OPERATIVE DIAGNOSIS:  AUB  PROCEDURE:  Procedure(s): DILATATION & CURETTAGE/HYSTEROSCOPY WITH NOVASURE ABLATION, paracervical block (N/A) INTRAUTERINE DEVICE (IUD) REMOVAL  SURGEON:  Surgeon(s) and Role:    * [name], MD - Primary  ANESTHESIA:   general and paracervical block  EBL:  10cc  BLOOD ADMINISTERED:none  DRAINS: none   LOCAL MEDICATIONS USED:  MARCAINE     SPECIMEN:  Source of Specimen:  endometrial curettings  DISPOSITION OF SPECIMEN:  PATHOLOGY  COUNTS:  YES  TOURNIQUET:  * No tourniquets in log *  DICTATION: .Note written in EPIC  PLAN OF CARE: Discharge to home after PACU  PATIENT DISPOSITION:  PACU - hemodynamically stable.   Delay start of Pharmacological VTE agent (>24hrs) due to surgical blood loss or risk of bleeding: not applicable  Complications: none  50 yo female with h/o AUB. Pt has failed outpatient conservative meds. She wanted definitive management.   The risks, benefits, and alternatives of surgery were explained, understood, and accepted. The consents were signed and all questions were answered. She was taken to the operating room and general anesthesia was applied without complication. She was placed in the dorsal lithotomy position and her vagina and abdomen were prepped and draped in the usual sterile fashion. A bimanual exam revealed a normal size and shape anteverted mobile uterus. Her adnexa were non-enlarged.   A bivalved speculum was placed in the patients' vagina and the anterior lip of the cervix was grasped with a single toothed tenaculum. A paracervical block was performed at 5 and 7 o'clock with 20cc of 0.5% Marcaine.   The endometrial cavity was sounded to 10.5cm and the endocervical length measured 4cm. A hysteroscope was inserted and an IUD was noted in the endometrial cavity. Using Randall stone forceps the IUD  was removed. The endometrium was noted to be atrophic and no polyps or fibroids were noted. The ostia on both sides were noted.  The scope was removed and a sharp currete was used to scape the lining of the uterus until a gritty texture was noted throughout.  Specimens were sent to pathology.  The NovaSure device was then inserted and seated using 6.5cm as the cavity length and 4.7cm as the cavity width.  The total activation time was 54 sec at a power of 168.  The hysteroscope was reinserted and an even burn pattern was noted to the fundus.  The single toothed tenaculum was removed at the end of the case and no bleeding was noted from the cervix.   The patient was extubated and taken to the recovery room in stable condition.  Sponge, lap and instrument counts were correct.  There were no complications. The IUD was discarded.    Jasmine Maceachern L. Harraway-Smith, M.D., [name]

## 2020-08-13 NOTE — Discharge Instructions (Signed)
Endometrial Ablation, Care After The following information offers guidance on how to care for yourself after your procedure. Your health care provider may also give you more specific instructions. If you have problems or questions, contact your health care provider. What can I expect after the procedure? After the procedure, it is common to have:  A need to urinate more often than usual for the first 24 hours.  Cramps that feel like menstrual cramps. These may last for 1-2 days.  A thin, watery vaginal discharge that is light pink or brown. This may last for a few weeks. Discharge will be heavy for the first few days after your procedure. You may need to wear a sanitary pad.  Nausea.  Vaginal bleeding for 4-6 weeks after the procedure, as tissue healing occurs. Follow these instructions at home: Medicines  Take over-the-counter and prescription medicines only as told by your health care provider.  If you were prescribed an antibiotic medicine, take it as told by your health care provider. Do not stop taking the antibiotic even if you start to feel better.  Ask your health care provider if the medicine prescribed to you: ? Requires you to avoid driving or using machinery. ? Can cause constipation. You may need to take these actions to prevent or treat constipation:  Drink enough fluid to keep your urine pale yellow.  Take over-the-counter or prescription medicines.  Eat foods that are high in fiber, such as beans, whole grains, and fresh fruits and vegetables.  Limit foods that are high in fat and processed sugars, such as fried or sweet foods.   Activity  If you were given a sedative during the procedure, it can affect you for several hours. Do not drive or operate machinery until your health care provider says that it is safe.  Do not have sex or put anything into your vagina until your health care provider says that it is safe.  Do not lift anything that is heavier than 5 lb  (2.3 kg), or the limit that you are told, until your health care provider says that it is safe.  Return to your normal activities as told by your health care provider. Ask your health care provider what activities are safe for you. General instructions  Do not take baths, swim, or use a hot tub until your health care provider says that it is safe. You will be able to take showers.  Check your vaginal area every day for signs of infection. Check for: ? Redness, swelling, or more pain. ? More blood coming from your vagina. ? A bad-smelling discharge.  Keep all follow-up visits. This is important. Contact a health care provider if:  You have vaginal redness, swelling, or more pain.  You have discharge or bleeding from your vagina that is getting worse.  You have a bad-smelling vaginal discharge.  You have a fever or chills.  You have trouble urinating. Get help right away if:  You have heavy, bright red vaginal bleeding that may include blood clots.  You have severe cramps that do not get better with medicine. Summary  After endometrial ablation, it is normal to have a thin, watery vaginal discharge that is light pink or brown. This may last a few weeks and may be heavier right after the procedure.  Vaginal bleeding is common after the procedure and should get better with time.  Check your vaginal area every day for signs of infection, such as a bad-smelling discharge.  Keep all follow-up   visits. This is important. This information is not intended to replace advice given to you by your health care provider. Make sure you discuss any questions you have with your health care provider. Document Revised: 12/21/2019 Document Reviewed: 12/21/2019 Elsevier Patient Education  2021 [employer]. Endometrial Ablation Endometrial ablation is a procedure that destroys the thin inner layer of the lining of the uterus (endometrium). This procedure may be done:  To stop heavy menstrual  periods.  To stop bleeding that is causing anemia.  To control irregular bleeding.  To treat bleeding caused by small tumors (fibroids) in the endometrium. This procedure is often done as an alternative to major surgery, such as removal of the uterus and cervix (hysterectomy). As a result of this procedure:  You may not be able to have children. However, if you have not yet gone through menopause: ? You may still have a small chance of getting pregnant. ? You will need to use a reliable method of birth control after the procedure to prevent pregnancy.  You may stop having a menstrual period, or you may have only a small amount of bleeding during your period. Menstruation may return several years after the procedure. Tell a health care provider about:  Any allergies you have.  All medicines you are taking, including vitamins, herbs, eye drops, creams, and over-the-counter medicines.  Any problems you or family members have had with the use of anesthetic medicines.  Any blood disorders you have.  Any surgeries you have had.  Any medical conditions you have.  Whether you are pregnant or may be pregnant. What are the risks? Generally, this is a safe procedure. However, problems may occur, including:  A hole (perforation) in the uterus or bowel.  Infection in the uterus, bladder, or vagina.  Bleeding.  Allergic reaction to medicines.  Damage to nearby structures or organs.  An air bubble in the lung (air embolus).  Problems with pregnancy.  Failure of the procedure.  Decreased ability to diagnose cancer in the endometrium. Scar tissue forms after the procedure, making it more difficult to get a sample of the uterine lining. What happens before the procedure? Medicines Ask your health care provider about:  Changing or stopping your regular medicines. This is especially important if you take diabetes medicines or blood thinners.  Taking medicines such as aspirin and  ibuprofen. These medicines can thin your blood. Do not take these medicines before your procedure if your doctor tells you not to take them.  Taking over-the-counter medicines, vitamins, herbs, and supplements. Tests  You will have tests of your endometrium to make sure there are no precancerous cells or cancer cells present.  You may have an ultrasound of the uterus. General instructions  Do not use any products that contain nicotine or tobacco for at least 4 weeks before the procedure. These include cigarettes, chewing tobacco, and vaping devices, such as e-cigarettes. If you need help quitting, ask your health care provider.  You may be given medicines to thin the endometrium.  Ask your health care provider what steps will be taken to help prevent infection. These steps may include: ? Removing hair at the surgery site. ? Washing skin with a germ-killing soap. ? Taking antibiotic medicine.  Plan to have a responsible adult take you home from the hospital or clinic.  Plan to have a responsible adult care for you for the time you are told after you leave the hospital or clinic. This is important. What happens during the procedure?  You will lie on an exam table with your feet and legs supported as in a pelvic exam.  An IV will be inserted into one of your veins.  You will be given a medicine to help you relax (sedative).  A surgical tool with a light and camera (resectoscope) will be inserted into your vagina and moved into your uterus. This allows your surgeon to see inside your uterus.  Endometrial tissue will be destroyed and removed, using one of the following methods: ? Radiofrequency. This uses an electrical current to destroy the endometrium. ? Cryotherapy. This uses extreme cold to freeze the endometrium. ? Heated fluid. This uses a heated salt and water (saline) solution to destroy the endometrium. ? Microwave. This uses high-energy microwaves to heat up the endometrium  and destroy it. ? Thermal balloon. This involves inserting a catheter with a balloon tip into the uterus. The balloon tip is filled with heated fluid to destroy the endometrium. The procedure may vary among health care providers and hospitals.   What happens after the procedure?  Your blood pressure, heart rate, breathing rate, and blood oxygen level will be monitored until you leave the hospital or clinic.  You may have vaginal bleeding for 4-6 weeks after the procedure. You may also have: ? Cramps. ? A thin, watery vaginal discharge that is light pink or brown. ? A need to urinate more than usual. ? Nausea.  If you were given a sedative during the procedure, it can affect you for several hours. Do not drive or operate machinery until your health care provider says that it is safe.  Do not have sex or insert anything into your vagina until your health care provider says it is safe. Summary  Endometrial ablation is done to treat many causes of heavy menstrual bleeding. The procedure destroys the thin inner layer of the lining of the uterus (endometrium).  This procedure is often done as an alternative to major surgery, such as removal of the uterus and cervix (hysterectomy).  Plan to have a responsible adult take you home from the hospital or clinic. This information is not intended to replace advice given to you by your health care provider. Make sure you discuss any questions you have with your health care provider. Document Revised: 12/21/2019 Document Reviewed: 12/21/2019 Elsevier Patient Education  [employer].

## 2020-08-13 NOTE — Brief Op Note (Signed)
08/13/2020  10:00 AM  PATIENT:  Tiffany Peterson  50 y.o. female  PRE-OPERATIVE DIAGNOSIS:  AUB  POST-OPERATIVE DIAGNOSIS:  AUB  PROCEDURE:  Procedure(s): DILATATION & CURETTAGE/HYSTEROSCOPY WITH NOVASURE ABLATION, paracervical block (N/A) INTRAUTERINE DEVICE (IUD) REMOVAL  SURGEON:  Surgeon(s) and Role:    * [name], MD - Primary  ANESTHESIA:   general and paracervical block  EBL:  10cc  BLOOD ADMINISTERED:none  DRAINS: none   LOCAL MEDICATIONS USED:  MARCAINE     SPECIMEN:  Source of Specimen:  endometrial curettings  DISPOSITION OF SPECIMEN:  PATHOLOGY  COUNTS:  YES  TOURNIQUET:  * No tourniquets in log *  DICTATION: .Note written in EPIC  PLAN OF CARE: Discharge to home after PACU  PATIENT DISPOSITION:  PACU - hemodynamically stable.   Delay start of Pharmacological VTE agent (>24hrs) due to surgical blood loss or risk of bleeding: not applicable  Complications: none  Carolyn L. Harraway-Smith, M.D., [name]

## 2020-08-13 NOTE — Anesthesia Procedure Notes (Signed)
Procedure Name: LMA Insertion Date/Time: 08/13/2020 9:23 AM Performed by: Babs Bertin, CRNA Pre-anesthesia Checklist: Patient identified, Emergency Drugs available, Suction available and Patient being monitored Patient Re-evaluated:Patient Re-evaluated prior to induction Oxygen Delivery Method: Circle System Utilized Preoxygenation: Pre-oxygenation with 100% oxygen Induction Type: IV induction Ventilation: Mask ventilation without difficulty LMA: LMA inserted LMA Size: 4.0 Number of attempts: 1 Airway Equipment and Method: Bite block Placement Confirmation: positive ETCO2 Tube secured with: Tape Dental Injury: Teeth and Oropharynx as per pre-operative assessment

## 2020-08-13 NOTE — Telephone Encounter (Signed)
-----   Message from [name], MD sent at 08/13/2020 10:11 AM EST ----- Please schedule 4 weeks post op check.    Thx,  Clh-S

## 2020-08-13 NOTE — Anesthesia Postprocedure Evaluation (Signed)
Anesthesia Post Note  Patient: Tiffany Peterson  Procedure(s) Performed: DILATATION & CURETTAGE/HYSTEROSCOPY WITH NOVASURE ABLATION, paracervical block (N/A Uterus) INTRAUTERINE DEVICE (IUD) REMOVAL (Uterus)     Patient location during evaluation: PACU Anesthesia Type: General Level of consciousness: awake and alert Pain management: pain level controlled Vital Signs Assessment: post-procedure vital signs reviewed and stable Respiratory status: spontaneous breathing, nonlabored ventilation, respiratory function stable and patient connected to nasal cannula oxygen Cardiovascular status: blood pressure returned to baseline and stable Postop Assessment: no apparent nausea or vomiting Anesthetic complications: no   No complications documented.  Last Vitals:  Vitals:   08/13/20 1055 08/13/20 1113  BP: (!) 150/92   Pulse: 62   Resp: 15   Temp:  (!) 36.2 C  SpO2: 98%     Last Pain:  Vitals:   08/13/20 1025  PainSc: 4                  Tiajuana Amass

## 2020-08-13 NOTE — Transfer of Care (Signed)
Immediate Anesthesia Transfer of Care Note  Patient: Dareen Gutzwiller  Procedure(s) Performed: DILATATION & CURETTAGE/HYSTEROSCOPY WITH NOVASURE ABLATION, paracervical block (N/A Uterus) INTRAUTERINE DEVICE (IUD) REMOVAL (Uterus)  Patient Location: PACU  Anesthesia Type:General  Level of Consciousness: responds to stimulation  Airway & Oxygen Therapy: Patient Spontanous Breathing and Patient connected to face mask oxygen  Post-op Assessment: Report given to RN and Post -op Vital signs reviewed and stable  Post vital signs: Reviewed and stable  Last Vitals:  Vitals Value Taken Time  BP    Temp    Pulse 66 08/13/20 1011  Resp    SpO2 100 % 08/13/20 1011  Vitals shown include unvalidated device data.  Last Pain:  Vitals:   08/13/20 0803  PainSc: 0-No pain      Patients Stated Pain Goal: 2 (81/10/31 5945)  Complications: No complications documented.

## 2020-08-13 NOTE — Progress Notes (Signed)
Dr. Ola Spurr aware of patient's blood pressure, no new orders received.

## 2020-08-13 NOTE — H&P (Signed)
Preoperative History and Physical  Tiffany Peterson is a 50 y.o. [id number] here for surgical management of AUB.   Proposed surgery:  hysteroscopy with dilation and curettage and endometrial ablation.  Past Medical History:  Diagnosis Date  . Anemia   . Essential hypertension 08/19/2016  . Morbid obesity ([place])   . Vaginal Pap smear, abnormal    colposcopy   Past Surgical History:  Procedure Laterality Date  . KNEE ARTHROSCOPY WITH EXCISION BAKER'S CYST    . TUBAL LIGATION     OB History    Gravida  4   Para  4   Term  4   Preterm      AB      Living  4     SAB      IAB      Ectopic      Multiple      Live Births  4          Patient denies any cervical dysplasia or STIs. Medications Prior to Admission  Medication Sig Dispense Refill Last Dose  . amLODipine (NORVASC) 5 MG tablet Take 1 tablet (5 mg total) by mouth daily. 30 tablet 3 08/13/2020 at 0515  . APPLE CIDER VINEGAR PO Take 3 capsules by mouth in the morning and at bedtime.   08/12/2020 at Unknown time  . cyclobenzaprine (FLEXERIL) 10 MG tablet Take 10 mg by mouth 3 (three) times daily as needed for muscle spasms.   Past Week at Unknown time  . esomeprazole (NEXIUM) 20 MG capsule Take 20 mg by mouth daily as needed (acid reflux).   Past Week at Unknown time  . ibuprofen (ADVIL) 800 MG tablet Take 800 mg by mouth every 8 (eight) hours as needed for moderate pain.   Past Week at Unknown time  . lisinopril (ZESTRIL) 20 MG tablet Take 1 tablet (20 mg total) by mouth daily. 30 tablet 3 08/12/2020 at Unknown time  . megestrol (MEGACE) 40 MG tablet Take 2 tablets (80 mg total) by mouth 2 (two) times daily. Take 40 mg twice daily for 10 days (Patient taking differently: Take 80 mg by mouth 2 (two) times daily.) 240 tablet 2 08/12/2020 at Unknown time  . metoprolol succinate (TOPROL-XL) 50 MG 24 hr tablet Take 1 tablet (50 mg total) by mouth daily. Take with or immediately following a meal. 30 tablet 3 08/13/2020 at 0515  .  spironolactone (ALDACTONE) 25 MG tablet Take 1 tablet (25 mg total) by mouth daily. 30 tablet 3 08/12/2020 at Unknown time  . vitamin B-12 (CYANOCOBALAMIN) 1000 MCG tablet Take 1,000 mcg by mouth daily.   Past Week at Unknown time  . gabapentin (NEURONTIN) 400 MG capsule Take 1 capsule (400 mg total) by mouth 3 (three) times daily. (Patient not taking: No sig reported) 90 capsule 3 Not Taking at Unknown time  . lidocaine (LIDODERM) 5 % Place 1 patch onto the skin daily. Remove & Discard patch within 12 hours or as directed by MD (Patient not taking: No sig reported) 30 patch 0     No Known Allergies Social History:   reports that she has never smoked. She has never used smokeless tobacco. She reports current alcohol use. She reports that she does not use drugs. Family History  Problem Relation Age of Onset  . Stroke Maternal Aunt   . Diabetes Father   . Hypertension Father   . Diabetes Mother   . Hypertension Mother   . Cancer Neg Hx   .  Heart attack Neg Hx   . Colon cancer Neg Hx   . Esophageal cancer Neg Hx     Review of Systems: Noncontributory  PHYSICAL EXAM: Blood pressure (!) 162/99, pulse 85, temperature 98.4 F (36.9 C), resp. rate 20, height 5\' 4"  (1.626 m), weight 132 kg, SpO2 99 %. General appearance - alert, well appearing, and in no distress Chest - clear to auscultation, no wheezes, rales or rhonchi, symmetric air entry Heart - normal rate and regular rhythm Abdomen - soft, nontender, nondistended, no masses or organomegaly Pelvic - examination not indicated Extremities - peripheral pulses normal, no pedal edema, no clubbing or cyanosis  Labs: Results for orders placed or performed during the hospital encounter of 08/13/20 (from the past 336 hour(s))  Pregnancy, urine POC   Collection Time: 08/13/20  6:56 AM  Result Value Ref Range   Preg Test, Ur NEGATIVE NEGATIVE  Results for orders placed or performed during the hospital encounter of 08/10/20 (from the past 336  hour(s))  SARS CORONAVIRUS 2 (TAT 6-24 HRS) Nasopharyngeal Nasopharyngeal Swab   Collection Time: 08/10/20  4:26 PM   Specimen: Nasopharyngeal Swab  Result Value Ref Range   SARS Coronavirus 2 NEGATIVE NEGATIVE    Imaging Studies: No results found.  Assessment: Patient Active Problem List   Diagnosis Date Noted  . Iron deficiency anemia due to chronic blood loss 12/07/2018  . Menorrhagia 12/07/2018  . Meralgia paraesthetica, right 12/01/2018  . Chest pain in adult 10/12/2018  . Anemia 10/11/2018  . Oropharyngeal dysphagia 10/03/2018  . Morbid obesity ([place])   . Hypersomnia 09/14/2016  . Sinus congestion 09/14/2016  . Insomnia 09/14/2016  . Essential hypertension 08/19/2016  . 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 Right ovarian cyst 11/13/2013    Plan: Patient will undergo surgical management with hysteroscopy with dilation and curettage and endometrial ablation.   The risks of surgery were discussed in detail with the patient including but not limited to: bleeding which may require transfusion or reoperation; infection which may require antibiotics; injury to surrounding organs which may involve bowel, bladder, ureters ; need for additional procedures including laparoscopy or laparotomy; thromboembolic phenomenon, surgical site problems and other postoperative/anesthesia complications. Likelihood of success in alleviating the patient's condition was discussed. Routine postoperative instructions will be reviewed with the patient and her family in detail after surgery.  The patient concurred with the proposed plan, giving informed written consent for the surgery.  Patient has been NPO since last night she will remain NPO for procedure.  Anesthesia and OR aware.  Preoperative prophylactic antibiotics and SCDs ordered on call to the OR.  To OR when ready.  Conlin Brahm L. Ihor Dow, M.D., [facility] 08/13/2020 8:57 AM

## 2020-08-14 ENCOUNTER — Encounter (HOSPITAL_COMMUNITY): Payer: Self-pay | Admitting: Obstetrics & Gynecology

## 2020-08-14 LAB — SURGICAL PATHOLOGY

## 2020-08-20 ENCOUNTER — Telehealth: Payer: Self-pay

## 2020-08-20 DIAGNOSIS — B9689 Other specified bacterial agents as the cause of diseases classified elsewhere: Secondary | ICD-10-CM

## 2020-08-20 DIAGNOSIS — N760 Acute vaginitis: Secondary | ICD-10-CM

## 2020-08-20 MED ORDER — METRONIDAZOLE 500 MG PO TABS: 500.0000 mg | ORAL_TABLET | Freq: Two times a day (BID) | ORAL | 0 refills | Status: DC

## 2020-08-20 NOTE — Telephone Encounter (Signed)
Called pt to discuss pain and vaginal discharge. Pt made aware that Per Dr. Ihor Dow, she should not need need to sit or have any other accommodations at work. Advised pt to take Motrin for pain and to take Flagyl for the vaginal odor. Flagyl 500 mg BID x 7 days was sent to her.Understanding was voiced.  Phelan Schadt l [place], CMA

## 2020-09-11 ENCOUNTER — Other Ambulatory Visit: Payer: Self-pay

## 2020-09-11 ENCOUNTER — Encounter: Payer: Self-pay | Admitting: Obstetrics & Gynecology

## 2020-09-11 ENCOUNTER — Ambulatory Visit (INDEPENDENT_AMBULATORY_CARE_PROVIDER_SITE_OTHER): Payer: Commercial Managed Care - PPO | Admitting: Obstetrics & Gynecology

## 2020-09-11 VITALS — BP 156/94 | HR 68 | Ht 64.0 in | Wt 296.0 lb

## 2020-09-11 DIAGNOSIS — N939 Abnormal uterine and vaginal bleeding, unspecified: Secondary | ICD-10-CM

## 2020-09-11 NOTE — Progress Notes (Signed)
History:  50 y.o.LMP here today for 2 week post op check.Pt is s/p hysteroscopy with D&C and endometrial ablation and removal of IUD on 08/13/2020.  Pt reports that she is doing well. She is eating and passing stools without difficulty.   Pt denies pain.   The following portions of the patient's history were reviewed and updated as appropriate: allergies, current medications, past family history, past medical history, past social history, past surgical history and problem list.  Review of Systems:  Pertinent items are noted in HPI.    Objective:  Physical Exam BP (!) 156/94   Pulse 68   Ht 5\' 4"  (1.626 m)   Wt 296 lb (134.3 kg)   BMI 50.81 kg/m   CONSTITUTIONAL: Well-developed, well-nourished female in no acute distress.  HENT:  Normocephalic, atraumatic EYES: Conjunctivae and EOM are normal. No scleral icterus.  NECK: Normal range of motion SKIN: Skin is warm and dry. No rash noted. Not diaphoretic.No pallor. [place]: Alert and oriented to person, place, and time. Normal coordination.  Pelvic: deferred  Labs and Imaging Surg path 08/13/2020 ENDOMETRIUM, CURETTAGE:  - Inactive endometrium.  - No hyperplasia or carcinoma.    GROSS DESCRIPTION:   Received in formalin are dark red soft tissue fragments that are  entirely submitted.  Volume: 1.4 x 1 x 0.25 cm. (1 B)    Assessment & Plan:  4 week post op check following hysteroscopy with D&C and endometrial ablation and removal of IUD on 08/13/2020.  Doing well  Reviewed her surg path.   Reviewed post op instructions and activities  Gradual increase in activities  F/u in 1 year or sooner prn  All questions answered.   Braiden Rodman L. Harraway-Smith, M.D., [name]

## 2020-09-11 NOTE — Progress Notes (Signed)
Patient is four weeks post op D&C. No bleeding and patient reports feeling well. Kathrene Alu RN

## 2020-11-19 IMAGING — US TRANSVAGINAL ULTRASOUND OF PELVIS
1 series · 13 of 25 positions shown · non-contrast
Comparison: Prior CT from 10/23/2013.

CLINICAL DATA: Initial evaluation for extended agile bleeding for 2
years.



[Series 1: transvaginal ultrasound of pelvis · 36 acquisitions, 13 frames shown]
[im 1/36]
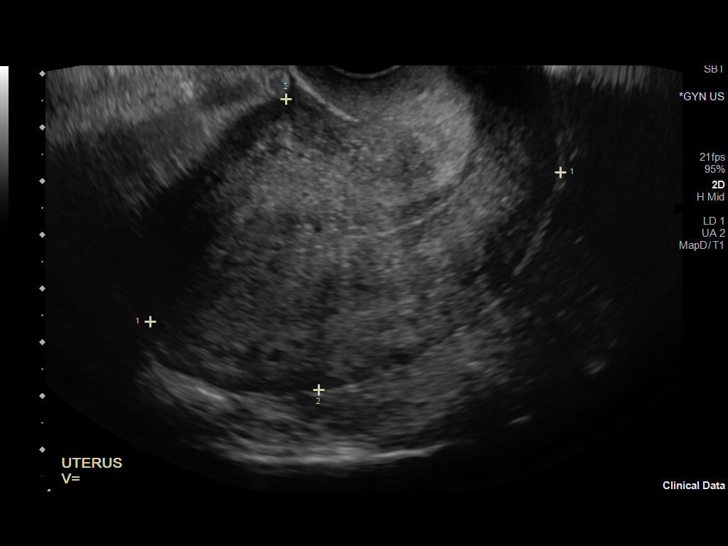
[im 3/36]
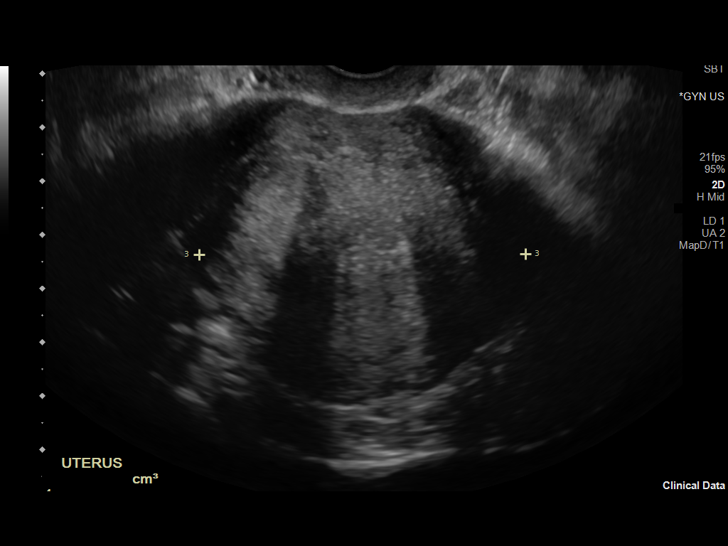
[im 6/36]
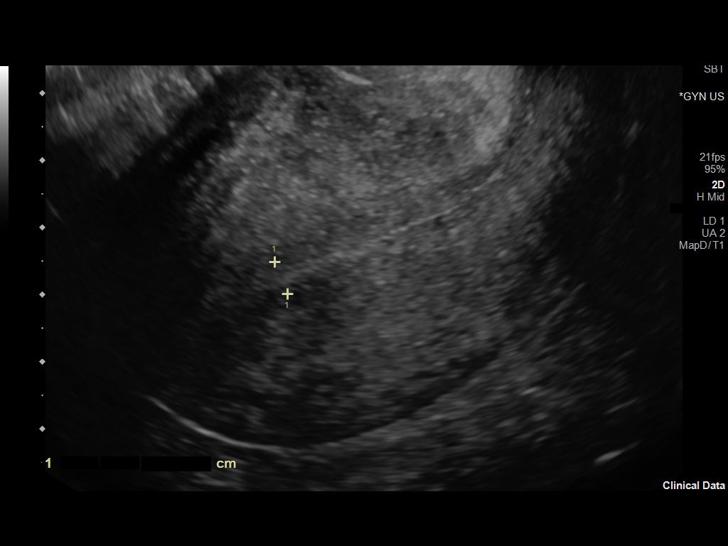
[im 9/36]
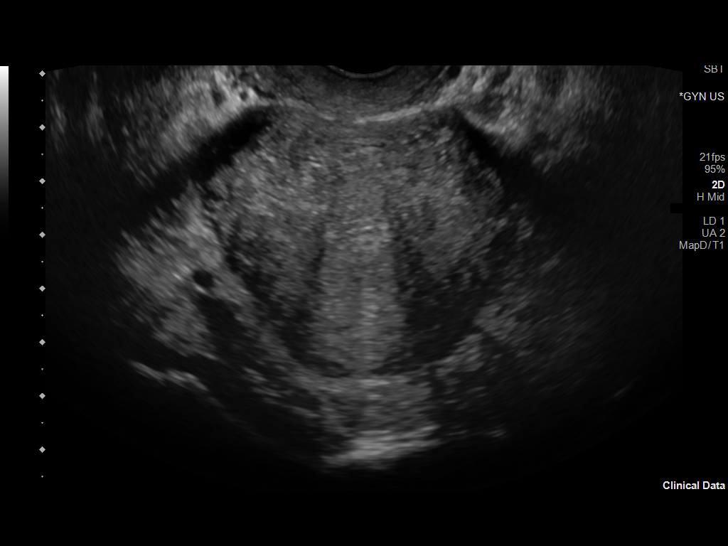
[im 12/36]
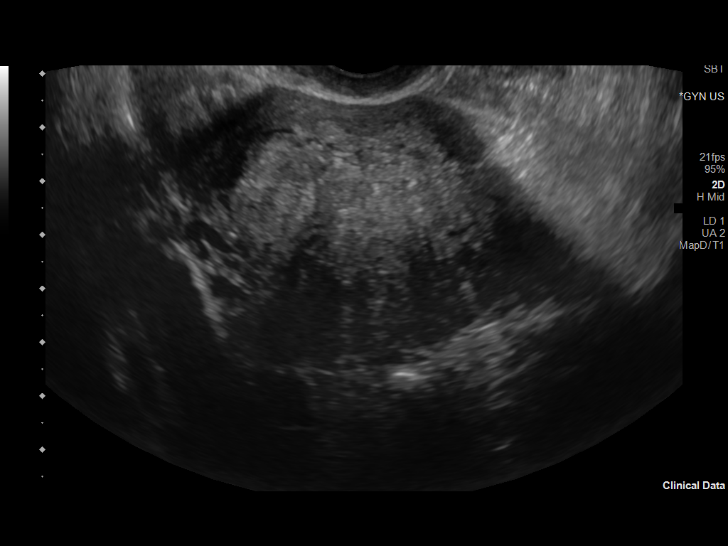
[im 15/36]
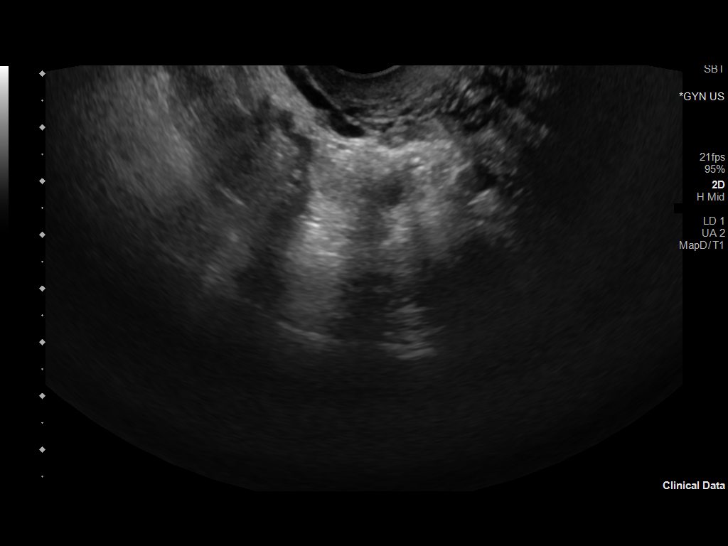
[im 18/36]
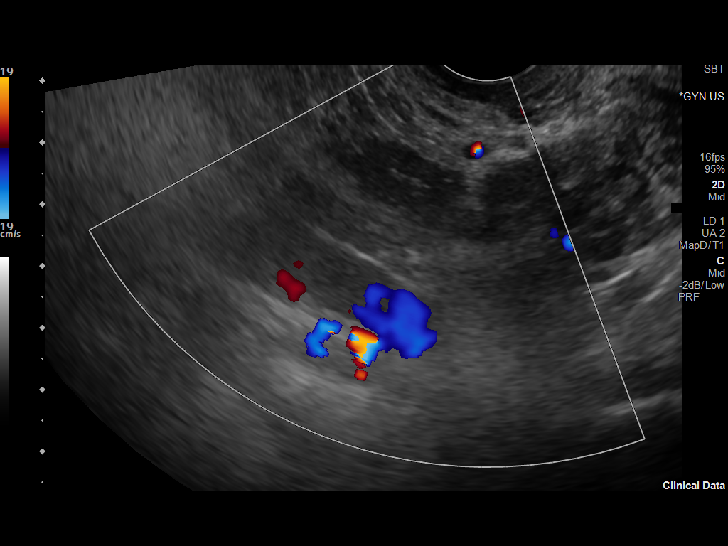
[im 21/36]
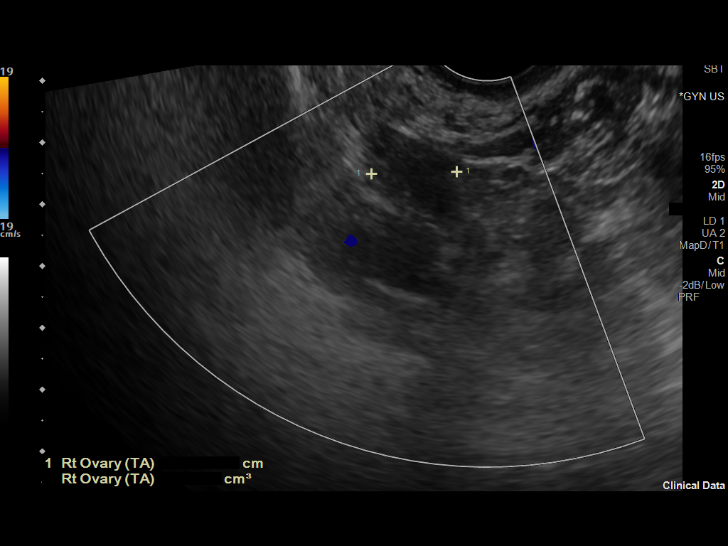
[im 24/36]
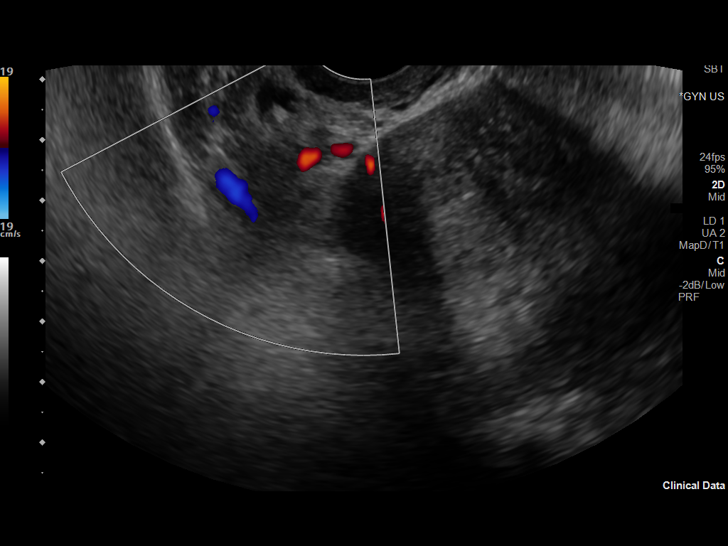
[im 27/36]
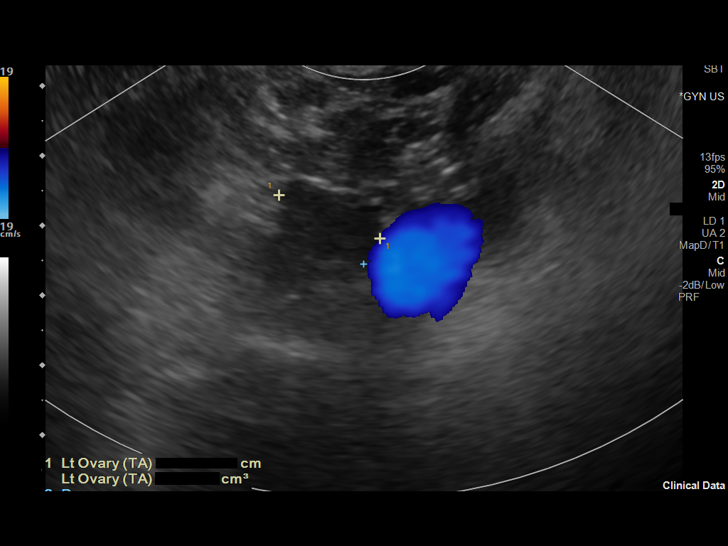
[im 30/36]
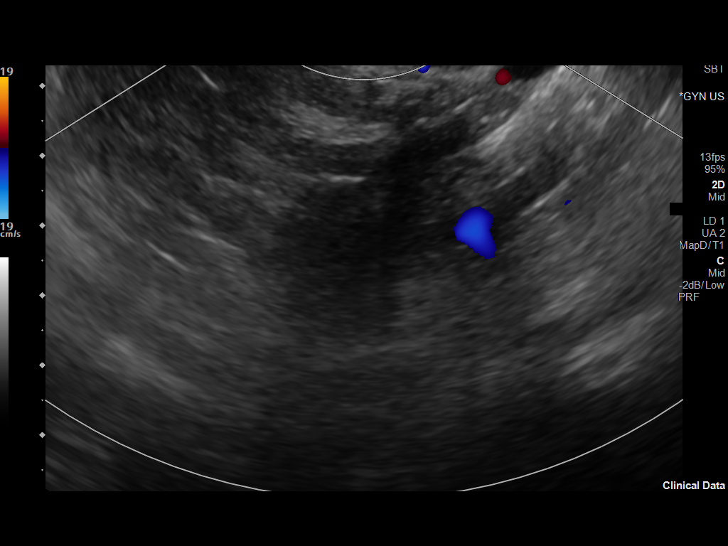
[im 33/36]
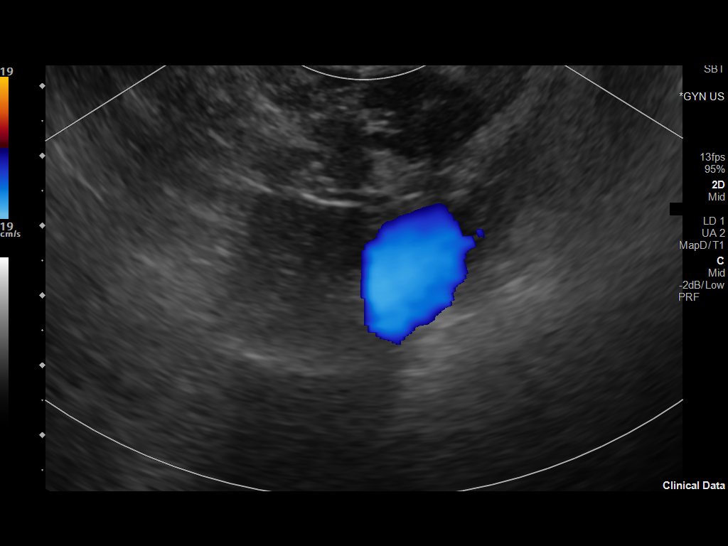
[im 36/36]
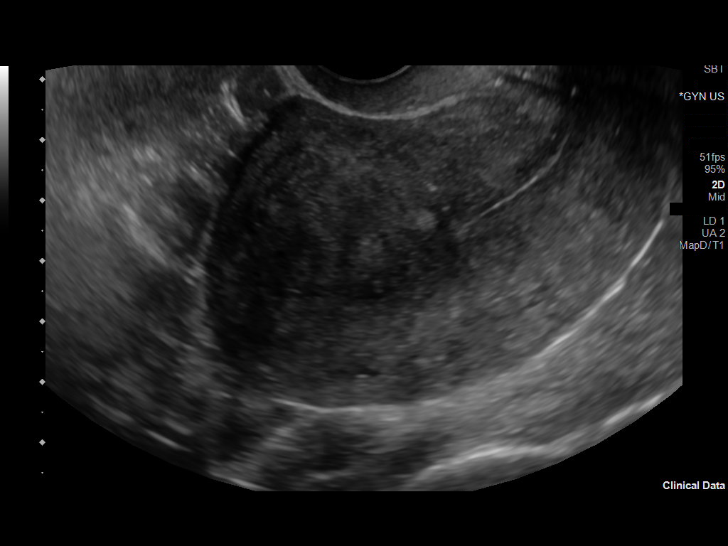

[13 of 25 positions shown; findings below may reference images not displayed]

FINDINGS: Uterus

Measurements: 8.1 x 5.5 x 6.1 cm = volume: 141.1 mL. 2.4 x 1.9 x
cm intramural fibroid present at the right anterior uterine body.

Endometrium

Thickness: 5 mm.  No focal abnormality visualized.

Right ovary

Measurements: 2.3 x 1.3 x 1.0 cm = volume: 1.5 mL. Normal
appearance/no adnexal mass.

Left ovary

Measurements: 2.4 x 1.3 x 1.6 cm = volume: 2.5 mL. Normal
appearance/no adnexal mass.

Other findings

No abnormal free fluid.
IMPRESSION: 1. Endometrial stripe measures 5 mm in maximal thickness. If
bleeding remains unresponsive to hormonal or medical therapy,
sonohysterogram should be considered for focal lesion work-up. (Ref:
Radiological Reasoning: Algorithmic Workup of Abnormal Vaginal
Bleeding with Endovaginal Sonography and Sonohysterography. AJR
[id number]; 191:S68-73).
2. 2.4 cm intramural fibroid within the right uterine body.
3. Normal sonographic appearance of the ovaries. No adnexal mass or
free fluid.

## 2020-12-03 IMAGING — CR RIGHT ANKLE - COMPLETE 3+ VIEW
3 series · 3 of 3 positions shown · non-contrast
Comparison: None.

CLINICAL DATA: Fall 3 weeks prior, pain medially

EXAM:
RIGHT ANKLE - COMPLETE 3+ VIEW

[t ankle joint ap right]
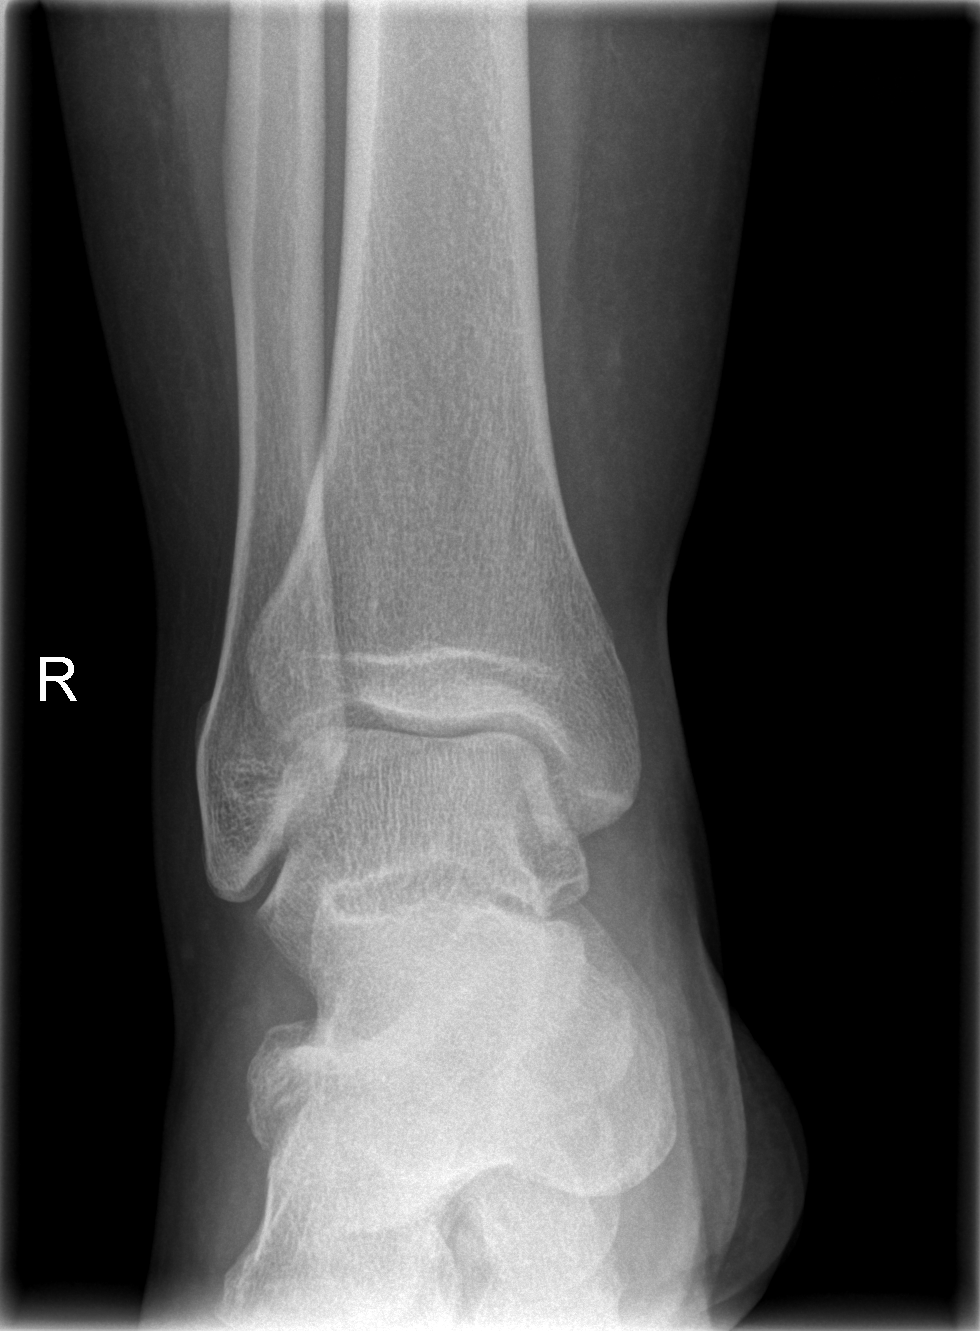

[t ankle joint oblique right]
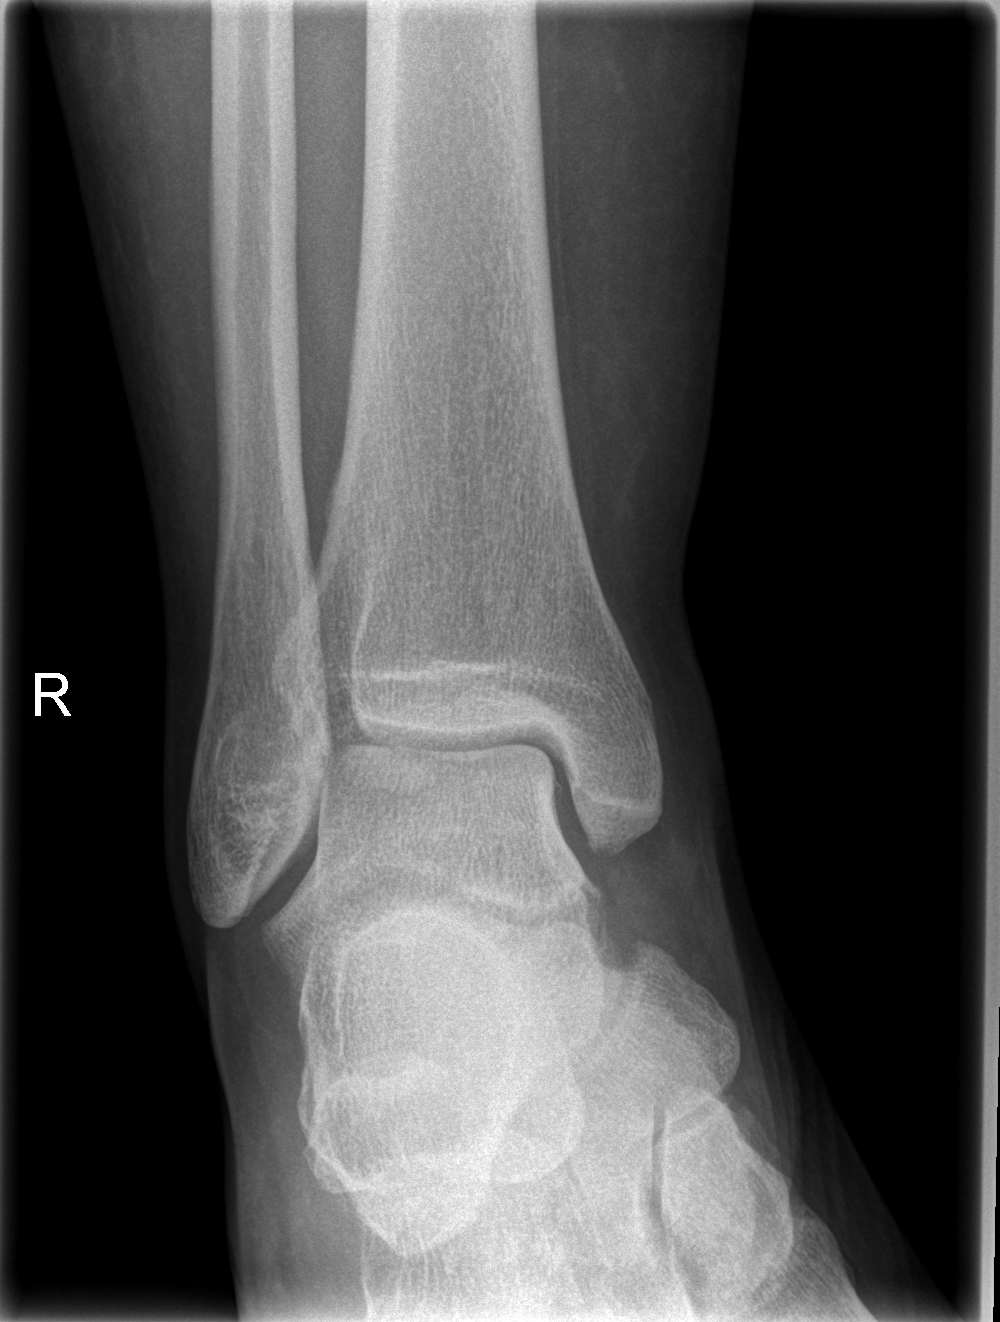

[t ankle joint lat right]
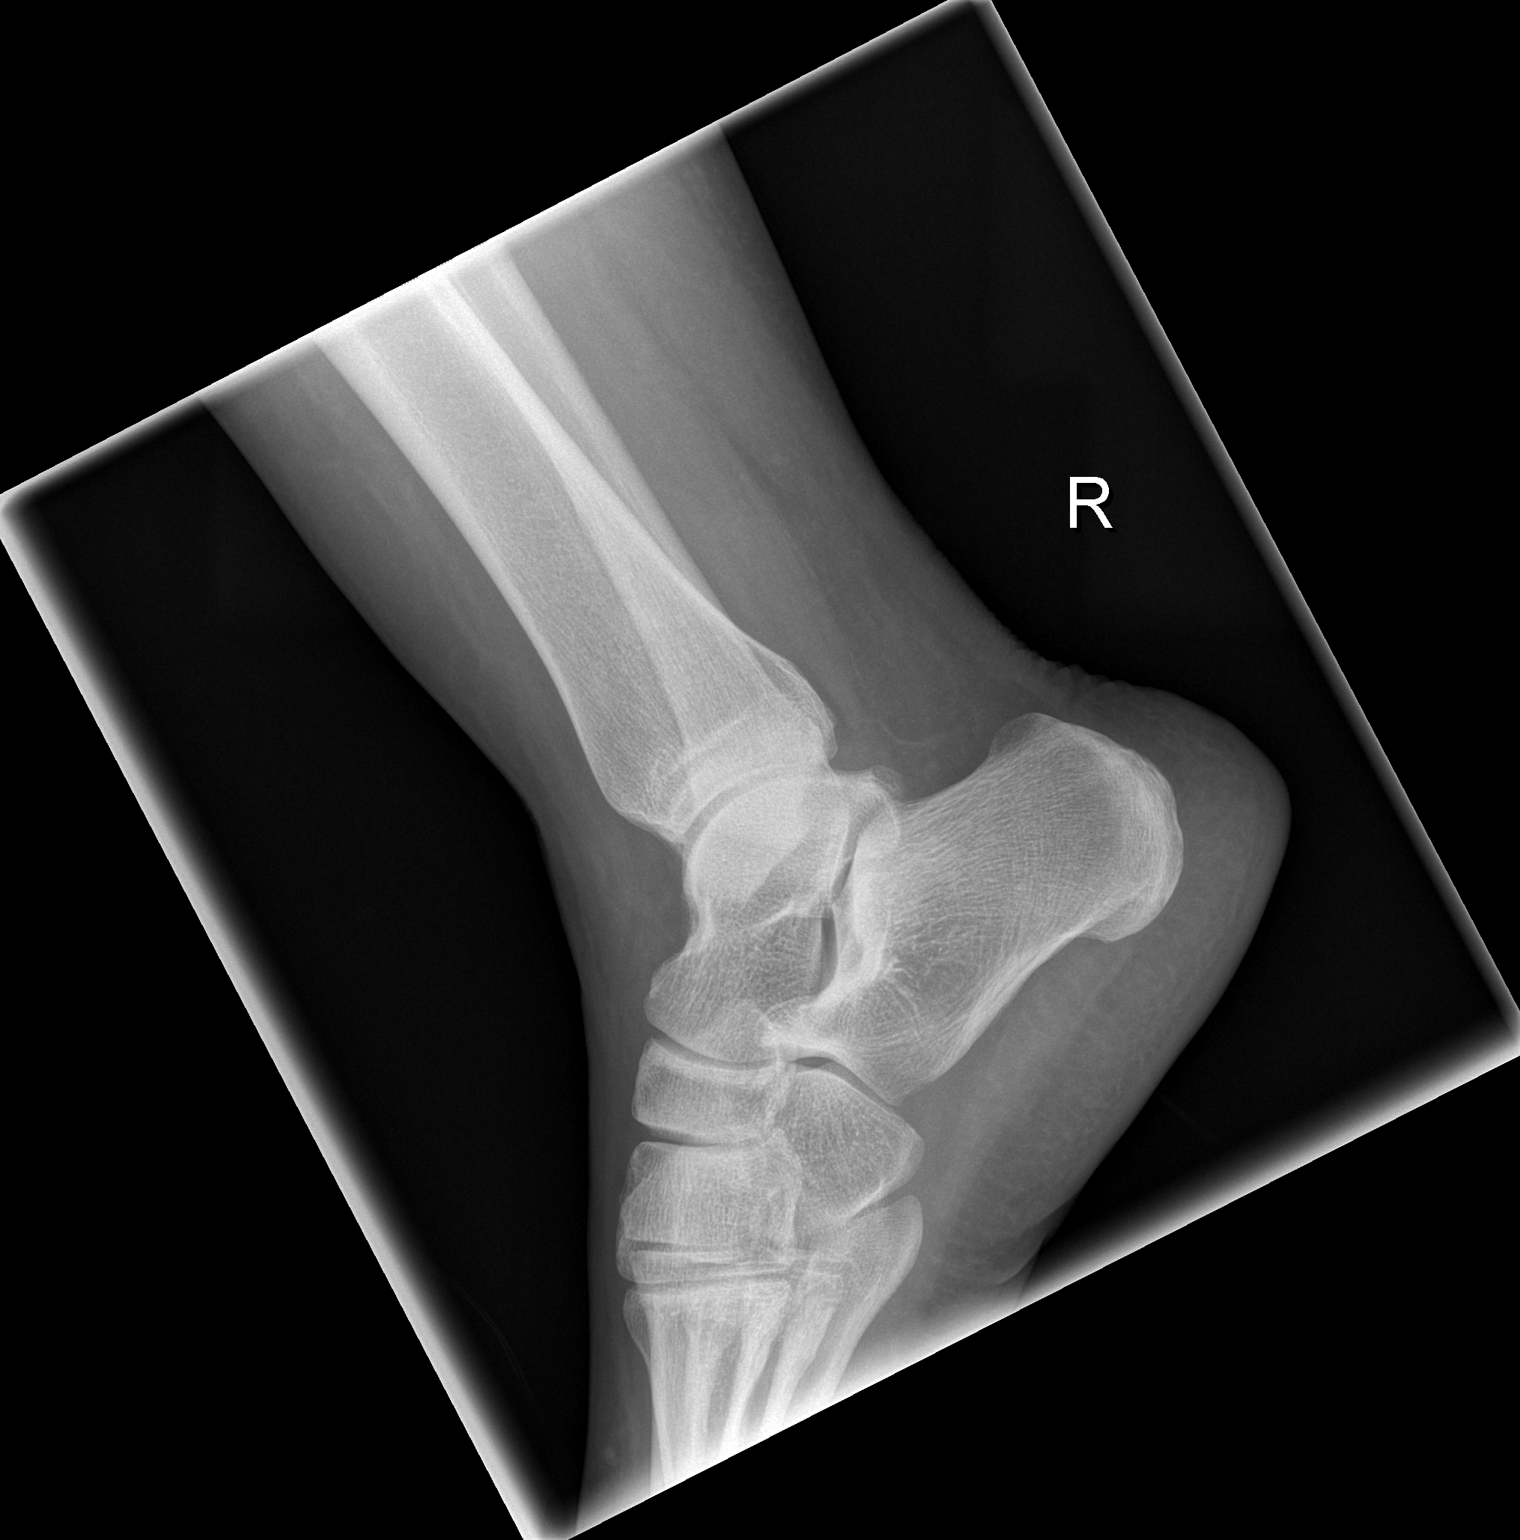

[3 of 3 positions shown; findings below may reference images not displayed]

FINDINGS: Soft tissue thickening along the medial malleolus with trace right
ankle joint effusion. No acute fracture or traumatic malalignment.
He
IMPRESSION: Medial soft tissue swelling and trace effusion without acute
fracture or traumatic malalignment.

## 2021-03-31 ENCOUNTER — Other Ambulatory Visit: Payer: Self-pay

## 2021-03-31 ENCOUNTER — Encounter: Payer: Self-pay | Admitting: Family Medicine

## 2021-03-31 ENCOUNTER — Ambulatory Visit (INDEPENDENT_AMBULATORY_CARE_PROVIDER_SITE_OTHER): Payer: Commercial Managed Care - PPO | Admitting: Family Medicine

## 2021-03-31 VITALS — BP 122/84 | HR 58 | Temp 97.9°F | Ht 64.0 in | Wt 282.5 lb

## 2021-03-31 DIAGNOSIS — I10 Essential (primary) hypertension: Secondary | ICD-10-CM

## 2021-03-31 DIAGNOSIS — K5900 Constipation, unspecified: Secondary | ICD-10-CM

## 2021-03-31 DIAGNOSIS — K219 Gastro-esophageal reflux disease without esophagitis: Secondary | ICD-10-CM | POA: Insufficient documentation

## 2021-03-31 MED ORDER — ESOMEPRAZOLE MAGNESIUM 20 MG PO CPDR: 20.0000 mg | DELAYED_RELEASE_CAPSULE | Freq: Every day | ORAL | 1 refills | Status: DC | PRN

## 2021-03-31 MED ORDER — METOPROLOL SUCCINATE ER 50 MG PO TB24: 50.0000 mg | ORAL_TABLET | Freq: Every day | ORAL | 1 refills | Status: DC

## 2021-03-31 MED ORDER — SPIRONOLACTONE 25 MG PO TABS: 25.0000 mg | ORAL_TABLET | Freq: Every day | ORAL | 1 refills | Status: DC

## 2021-03-31 MED ORDER — LISINOPRIL 20 MG PO TABS: 20.0000 mg | ORAL_TABLET | Freq: Every day | ORAL | 1 refills | Status: DC

## 2021-03-31 MED ORDER — IBUPROFEN 800 MG PO TABS: 800.0000 mg | ORAL_TABLET | Freq: Three times a day (TID) | ORAL | 2 refills | Status: DC | PRN

## 2021-03-31 MED ORDER — AMLODIPINE BESYLATE 5 MG PO TABS: 5.0000 mg | ORAL_TABLET | Freq: Every day | ORAL | 1 refills | Status: DC

## 2021-03-31 NOTE — Patient Instructions (Signed)
Try to drink 55-60 oz of water daily outside of exercise.  Try MiraLAX (polyethylene glycol) 1-2 times daily over the next 3-4 days. If no improvement, try using an enema. Stay well hydrated and keep lots of fiber in your diet.  Let us know if you need anything.

## 2021-03-31 NOTE — Progress Notes (Signed)
Chief Complaint  Patient presents with   Flank Pain    right    Subjective: Patient is a 50 y.o. female here for abd pain.  Started 1 week ago. Shifts around in stomach. Passing gas improves s/ss. Sometimes it radiates to her R lower back. She did start going to the gym for the past 2 mo. BM's are regular for her. Took a laxative 2 d ago, had a BM 12 hrs which did help. Eating seems to make it worse. No a/w greasy/fattier foods. No N/V, inj, change in activity, skin changes. No bleeding, fevers, unintentional weight loss. Has taken ibuprofen than has helped.   Hypertension Patient presents for hypertension follow up. She does not monitor home blood pressures. She is not always compliant with medications-Norvasc 5 mg daily, Toprol-XL 50 mg daily, lisinopril 20 mg daily, spironolactone 25 mg daily. Patient has these side effects of medication: none She is sometimes adhering to a healthy diet overall. Exercise: Cardio and weight resistance exercise No chest pain or shortness of breath  GERD History of reflux for which she takes Nexium 20 mg daily as needed.  No adverse effects with the medication.  Symptoms are well controlled when she does take the medication. No unexplained wt loss, bleeding.   Past Medical History:  Diagnosis Date   Anemia    Essential hypertension 08/19/2016   Morbid obesity ([facility])    Vaginal Pap smear, abnormal    colposcopy    Objective: BP 122/84   Pulse (!) 58   Temp 97.9 F (36.6 C) (Oral)   Ht 5\' 4"  (1.626 m)   Wt 282 lb 8 oz (128.1 kg)   SpO2 98%   BMI 48.49 kg/m  General: Awake, appears stated age HEENT: MMM, EOMi Heart: RRR, no LE edema Abd: BS+, S, diffusely sore to palpation, neg Rovsing's, Carnett's, McBurney's, Murphy's Lungs: CTAB, no rales, wheezes or rhonchi. No accessory muscle use Psych: Age appropriate judgment and insight, normal affect and mood  Assessment and Plan: Constipation, unspecified constipation type  Essential  hypertension - Plan: lisinopril (ZESTRIL) 20 MG tablet, spironolactone (ALDACTONE) 25 MG tablet, amLODipine (NORVASC) 5 MG tablet, metoprolol succinate (TOPROL-XL) 50 MG 24 hr tablet, Basic metabolic panel  She is likely constipated given her exam and history.  Needs to increase her hydration.  MiraLAX 1-2 times daily for the next 3 days and may be need to use an enema after that.  High-fiber diet also recommended. Chronic, stable.  Continue lisinopril 20 mg daily, spironolactone 25 mg daily, Norvasc 5 mg daily, Toprol-XL 50 mg daily.  Check BMP today. Chronic, stable.  Continue Nexium 20 mg daily as needed. Follow-up in 3 months for physical or as needed. The patient voiced understanding and agreement to the plan.  [place], DO 03/31/21  3:59 PM

## 2021-04-01 ENCOUNTER — Ambulatory Visit: Payer: Commercial Managed Care - PPO | Admitting: Family Medicine

## 2021-04-01 LAB — BASIC METABOLIC PANEL
BUN: 20 mg/dL (ref 6–23)
CO2: 28 mEq/L (ref 19–32)
Calcium: 9.2 mg/dL (ref 8.4–10.5)
Chloride: 99 mEq/L (ref 96–112)
Creatinine, Ser: 1.05 mg/dL (ref 0.40–1.20)
GFR: 61.04 mL/min (ref >60.00)
Glucose, Bld: 85 mg/dL (ref 70–99)
Potassium: 4.2 mEq/L (ref 3.5–5.1)
Sodium: 135 mEq/L (ref 135–145)

## 2021-07-01 ENCOUNTER — Encounter: Payer: Self-pay | Admitting: Family Medicine

## 2021-07-01 ENCOUNTER — Ambulatory Visit (INDEPENDENT_AMBULATORY_CARE_PROVIDER_SITE_OTHER): Payer: Commercial Managed Care - PPO | Admitting: Family Medicine

## 2021-07-01 VITALS — BP 110/78 | HR 58 | Temp 98.3°F | Ht 64.0 in | Wt 282.0 lb

## 2021-07-01 DIAGNOSIS — Z1231 Encounter for screening mammogram for malignant neoplasm of breast: Secondary | ICD-10-CM | POA: Diagnosis not present

## 2021-07-01 DIAGNOSIS — Z0000 Encounter for general adult medical examination without abnormal findings: Secondary | ICD-10-CM | POA: Diagnosis not present

## 2021-07-01 DIAGNOSIS — Z1159 Encounter for screening for other viral diseases: Secondary | ICD-10-CM | POA: Diagnosis not present

## 2021-07-01 NOTE — Progress Notes (Signed)
Chief Complaint  Patient presents with   Annual Exam     Well Woman Tiffany Peterson is here for a complete physical.   Her last physical was >1 year ago.  Current diet: in general, a "healthy" diet. Current exercise: none. Weight is stable and she denies fatigue out of ordinary. Seatbelt? Yes Advanced directive? No  Health Maintenance Pap/HPV- Yes Mammogram- No Colon cancer screening-Yes Shingrix- No Tetanus- Yes Hep C screening- No HIV screening- Yes  Past Medical History:  Diagnosis Date   Anemia    Essential hypertension 08/19/2016   Morbid obesity ([place])    Vaginal Pap smear, abnormal    colposcopy     Past Surgical History:  Procedure Laterality Date   DILITATION & CURRETTAGE/HYSTROSCOPY WITH NOVASURE ABLATION N/A 08/13/2020   Procedure: DILATATION & CURETTAGE/HYSTEROSCOPY WITH NOVASURE ABLATION, paracervical block;  Surgeon: [name], MD;  Location: [facility];  Service: Gynecology;  Laterality: N/A;   IUD REMOVAL  08/13/2020   Procedure: INTRAUTERINE DEVICE (IUD) REMOVAL;  Surgeon: [name], MD;  Location: [facility];  Service: Gynecology;;   KNEE ARTHROSCOPY WITH EXCISION BAKER'S CYST     TUBAL LIGATION      Medications  Current Outpatient Medications on File Prior to Visit  Medication Sig Dispense Refill   amLODipine (NORVASC) 5 MG tablet Take 1 tablet (5 mg total) by mouth daily. 90 tablet 1   APPLE CIDER VINEGAR PO Take 3 capsules by mouth in the morning and at bedtime.     cyclobenzaprine (FLEXERIL) 10 MG tablet Take 10 mg by mouth 3 (three) times daily as needed for muscle spasms.     esomeprazole (NEXIUM) 20 MG capsule Take 1 capsule (20 mg total) by mouth daily as needed (acid reflux). 90 capsule 1   gabapentin (NEURONTIN) 400 MG capsule Take 1 capsule (400 mg total) by mouth 3 (three) times daily. 90 capsule 3   ibuprofen (ADVIL) 800 MG tablet Take 1 tablet (800 mg total) by mouth every 8 (eight) hours as needed for moderate pain. 30  tablet 2   lidocaine (LIDODERM) 5 % Place 1 patch onto the skin daily. Remove & Discard patch within 12 hours or as directed by MD 30 patch 0   lisinopril (ZESTRIL) 20 MG tablet Take 1 tablet (20 mg total) by mouth daily. 90 tablet 1   spironolactone (ALDACTONE) 25 MG tablet Take 1 tablet (25 mg total) by mouth daily. 90 tablet 1   vitamin B-12 (CYANOCOBALAMIN) 1000 MCG tablet Take 1,000 mcg by mouth daily.     Allergies No Known Allergies  Review of Systems: Constitutional:  no unexpected weight changes Eye:  no recent significant change in vision Ear/Nose/Mouth/Throat:  Ears:  no recent change in hearing Nose/Mouth/Throat:  no complaints of nasal congestion, no sore throat Cardiovascular: no chest pain Respiratory:  no shortness of breath Gastrointestinal:  no abdominal pain, no change in bowel habits GU:  Female: negative for dysuria or pelvic pain Musculoskeletal/Extremities:  no pain of the joints Integumentary (Skin/Breast):  no abnormal skin lesions reported Neurologic:  no headaches Endocrine:  denies fatigue  Exam BP 110/78    Pulse (!) 58    Temp 98.3 F (36.8 C) (Oral)    Ht 5\' 4"  (1.626 m)    Wt 282 lb (127.9 kg)    SpO2 97%    BMI 48.41 kg/m  General:  well developed, well nourished, in no apparent distress Skin:  no significant moles, warts, or growths Head:  no masses, lesions, or  tenderness Eyes:  pupils equal and round, sclera anicteric without injection Ears:  canals without lesions, TMs shiny without retraction, no obvious effusion, no erythema Nose:  nares patent, septum midline, mucosa normal, and no drainage or sinus tenderness Throat/Pharynx:  lips and gingiva without lesion; tongue and uvula midline; non-inflamed pharynx; no exudates or postnasal drainage Neck: neck supple without adenopathy, thyromegaly, or masses Lungs:  clear to auscultation, breath sounds equal bilaterally, no respiratory distress Cardio:  regular rate and rhythm, no LE edema Abdomen:   abdomen soft, nontender; bowel sounds normal; no masses or organomegaly Genital: Defer to GYN Musculoskeletal:  symmetrical muscle groups noted without atrophy or deformity Extremities:  no clubbing, cyanosis, or edema, no deformities, no skin discoloration Neuro:  gait normal; deep tendon reflexes normal and symmetric Psych: well oriented with normal range of affect and appropriate judgment/insight  Assessment and Plan  Well adult exam - Plan: CBC, Comprehensive metabolic panel, Lipid panel  Encounter for screening mammogram for malignant neoplasm of breast - Plan: MM DIGITAL SCREENING BILATERAL  Encounter for hepatitis C screening test for low risk patient - Plan: Hepatitis C antibody   Well 51 y.o. female. Counseled on diet and exercise. Advanced directive form provided today.  Mammogram ordered. Hep C screening ordered.  Other orders as above. Follow up in 6 mo or prn. The patient voiced understanding and agreement to the plan.  [place], DO 07/01/21 3:19 PM

## 2021-07-01 NOTE — Patient Instructions (Addendum)
Give [place] 2-3 business days to get the results of your labs back.   Keep the diet clean and stay active.  The new Shingrix vaccine (for shingles) is a 2 shot series. It can make people feel low energy, achy and almost like they have the flu for 48 hours after injection. Please plan accordingly when deciding on when to get this shot. Call our office for a nurse visit appointment to get this. The second shot of the series is less severe regarding the side effects, but it still lasts 48 hours.   Stop your metoprolol for now. Monitor your blood pressure. Send me a message in 2-3 weeks if doing well and I will tell you what to stop next.   Someone will reach out regarding your mammogram in the next few days.   Let us know if you need anything.

## 2021-07-02 LAB — COMPREHENSIVE METABOLIC PANEL
ALT: 12 U/L (ref 0–35)
AST: 13 U/L (ref 0–37)
Albumin: 4.1 g/dL (ref 3.5–5.2)
Alkaline Phosphatase: 79 U/L (ref 39–117)
BUN: 18 mg/dL (ref 6–23)
CO2: 31 mEq/L (ref 19–32)
Calcium: 9.3 mg/dL (ref 8.4–10.5)
Chloride: 99 mEq/L (ref 96–112)
Creatinine, Ser: 1.04 mg/dL (ref 0.40–1.20)
GFR: 61.63 mL/min (ref >60.00)
Glucose, Bld: 90 mg/dL (ref 70–99)
Potassium: 4.4 mEq/L (ref 3.5–5.1)
Sodium: 138 mEq/L (ref 135–145)
Total Bilirubin: 0.4 mg/dL (ref 0.2–1.2)
Total Protein: 7.7 g/dL (ref 6.0–8.3)

## 2021-07-02 LAB — CBC
HCT: 38.4 % (ref 36.0–46.0)
Hemoglobin: 12.2 g/dL (ref 12.0–15.0)
MCHC: 31.9 g/dL (ref 30.0–36.0)
MCV: 81 fl (ref 78.0–100.0)
Platelets: 326 10*3/uL (ref 150.0–400.0)
RBC: 4.74 Mil/uL (ref 3.87–5.11)
RDW: 15.6 % — ABNORMAL HIGH (ref 11.5–15.5)
WBC: 6.1 10*3/uL (ref 4.0–10.5)

## 2021-07-02 LAB — LIPID PANEL
Cholesterol: 173 mg/dL (ref 0–200)
HDL: 47.4 mg/dL (ref >39.00)
LDL Cholesterol: 105 mg/dL — ABNORMAL HIGH (ref 0–99)
NonHDL: 125.83
Total CHOL/HDL Ratio: 4
Triglycerides: 103 mg/dL (ref 0.0–149.0)
VLDL: 20.6 mg/dL (ref 0.0–40.0)

## 2021-07-02 LAB — HEPATITIS C ANTIBODY
Hepatitis C Ab: NONREACTIVE
SIGNAL TO CUT-OFF: <0.02 (ref <1.00)

## 2021-07-03 ENCOUNTER — Encounter: Payer: Self-pay | Admitting: General Practice

## 2021-07-11 ENCOUNTER — Other Ambulatory Visit: Payer: Self-pay | Admitting: Family Medicine

## 2021-07-15 ENCOUNTER — Ambulatory Visit (HOSPITAL_BASED_OUTPATIENT_CLINIC_OR_DEPARTMENT_OTHER)
Admission: RE | Admit: 2021-07-15 | Discharge: 2021-07-15 | Disposition: A | Discharge status: Home / Self Care | Payer: Commercial Managed Care - PPO | Source: Ambulatory Visit | Attending: Family Medicine | Admitting: Family Medicine

## 2021-07-15 ENCOUNTER — Other Ambulatory Visit: Payer: Self-pay

## 2021-07-15 ENCOUNTER — Encounter (HOSPITAL_BASED_OUTPATIENT_CLINIC_OR_DEPARTMENT_OTHER): Payer: Self-pay

## 2021-07-15 DIAGNOSIS — Z1231 Encounter for screening mammogram for malignant neoplasm of breast: Secondary | ICD-10-CM | POA: Insufficient documentation

## 2021-09-22 ENCOUNTER — Encounter (HOSPITAL_BASED_OUTPATIENT_CLINIC_OR_DEPARTMENT_OTHER): Payer: Self-pay | Admitting: Emergency Medicine

## 2021-09-22 ENCOUNTER — Other Ambulatory Visit: Payer: Self-pay

## 2021-09-22 ENCOUNTER — Emergency Department (HOSPITAL_BASED_OUTPATIENT_CLINIC_OR_DEPARTMENT_OTHER)
Admission: EM | Admit: 2021-09-22 | Discharge: 2021-09-22 | Disposition: A | Discharge status: Home / Self Care | Payer: Commercial Managed Care - PPO | Attending: Emergency Medicine | Admitting: Emergency Medicine

## 2021-09-22 ENCOUNTER — Telehealth: Payer: Self-pay

## 2021-09-22 ENCOUNTER — Telehealth (HOSPITAL_BASED_OUTPATIENT_CLINIC_OR_DEPARTMENT_OTHER): Payer: Self-pay | Admitting: Emergency Medicine

## 2021-09-22 ENCOUNTER — Emergency Department (HOSPITAL_BASED_OUTPATIENT_CLINIC_OR_DEPARTMENT_OTHER): Payer: Commercial Managed Care - PPO

## 2021-09-22 DIAGNOSIS — R059 Cough, unspecified: Secondary | ICD-10-CM | POA: Diagnosis present

## 2021-09-22 DIAGNOSIS — R052 Subacute cough: Secondary | ICD-10-CM | POA: Insufficient documentation

## 2021-09-22 DIAGNOSIS — R070 Pain in throat: Secondary | ICD-10-CM | POA: Insufficient documentation

## 2021-09-22 DIAGNOSIS — Z79899 Other long term (current) drug therapy: Secondary | ICD-10-CM | POA: Insufficient documentation

## 2021-09-22 DIAGNOSIS — I10 Essential (primary) hypertension: Secondary | ICD-10-CM | POA: Insufficient documentation

## 2021-09-22 DIAGNOSIS — R0789 Other chest pain: Secondary | ICD-10-CM | POA: Diagnosis not present

## 2021-09-22 MED ORDER — BENZONATATE 100 MG PO CAPS: 100.0000 mg | ORAL_CAPSULE | Freq: Three times a day (TID) | ORAL | 0 refills | Status: DC | PRN

## 2021-09-22 MED ORDER — OMEPRAZOLE 20 MG PO CPDR: 20.0000 mg | DELAYED_RELEASE_CAPSULE | Freq: Every day | ORAL | 0 refills | Status: DC

## 2021-09-22 MED ORDER — LIDOCAINE 5 % EX PTCH
1.0000 | MEDICATED_PATCH | CUTANEOUS | Status: DC
  Administered 2021-09-22: 1 via TRANSDERMAL
  Filled 2021-09-22: qty 1

## 2021-09-22 MED ORDER — LIDOCAINE VISCOUS HCL 2 % MT SOLN: 15.0000 mL | OROMUCOSAL | 0 refills | Status: DC | PRN

## 2021-09-22 MED ORDER — LIDOCAINE VISCOUS HCL 2 % MT SOLN
15.0000 mL | Freq: Once | OROMUCOSAL | Status: AC
  Administered 2021-09-22: 15 mL via OROMUCOSAL
  Filled 2021-09-22: qty 15

## 2021-09-22 MED ORDER — LIDOCAINE 4 % EX PTCH: 1.0000 | MEDICATED_PATCH | Freq: Two times a day (BID) | CUTANEOUS | 0 refills | Status: DC | PRN

## 2021-09-22 NOTE — ED Provider Notes (Signed)
?[facility] ?Provider Note ? ? ?CSN: [id number] ?Arrival date & time: 09/22/21  1518 ? ?  ? ?History ? ?Chief Complaint  ?Patient presents with  ? Cough  ?  X 2 MONTHS   ? ? ?Tiffany Peterson is a 51 y.o. female with past medical history of hypertension, morbid obesity, anemia.  Presents to the emergency department with a chief complaint of right chest wall pain, tickle in throat, and cough. ? ?Patient reports that she has been dealing with a cough for the last 2 months.  Cough has been a daily occurrence over this time.  Patient states that cough started off as being productive and now is nonproductive.  Patient states that she is coughing less but when she does that coughing is more intense.  Patient states that over the last few days she has had pain to her right chest wall.  Pain started after a coughing fit.  Pain is worse with coughing, touch, and movement.  Patient has been taking ibuprofen with minimal relief of symptoms. ? ?Additionally patient reports that she has been having a tickle feeling in her throat.  Patient thinks that this may be causing her cough.  Patient denies any drooling, trismus, high potato voice, trouble swallowing, shortness of breath, facial swelling. ? ?Denies any palpitations, leg swelling or tenderness, lightheadedness, syncope, hemoptysis, abdominal pain, nausea, vomiting, diarrhea, nipple discharge, changes to skin overlying breast, changes to areola or nipple. ? ? ?Cough ?Associated symptoms: no chest pain, no chills, no fever, no headaches, no rash and no shortness of breath   ? ?  ? ?Home Medications ?Prior to Admission medications   ?Medication Sig Start Date End Date Taking? Authorizing Provider  ?amLODipine (NORVASC) 5 MG tablet Take 1 tablet (5 mg total) by mouth daily. 03/31/21   Shelda Pal, DO  ?APPLE CIDER VINEGAR PO Take 3 capsules by mouth in the morning and at bedtime.    [provider]  ?cyclobenzaprine (FLEXERIL)  10 MG tablet Take 10 mg by mouth 3 (three) times daily as needed for muscle spasms.    [provider]  ?esomeprazole (NEXIUM) 20 MG capsule Take 1 capsule (20 mg total) by mouth daily as needed (acid reflux). 03/31/21   Shelda Pal, DO  ?gabapentin (NEURONTIN) 400 MG capsule Take 1 capsule (400 mg total) by mouth 3 (three) times daily. 12/28/18   Shelda Pal, DO  ?ibuprofen (ADVIL) 800 MG tablet TAKE 1 TABLET(800 MG) BY MOUTH EVERY 8 HOURS AS NEEDED FOR MODERATE PAIN 07/11/21   Shelda Pal, DO  ?lidocaine (LIDODERM) 5 % Place 1 patch onto the skin daily. Remove & Discard patch within 12 hours or as directed by MD 01/27/19   Ward, Ozella Almond, PA-C  ?lisinopril (ZESTRIL) 20 MG tablet Take 1 tablet (20 mg total) by mouth daily. 03/31/21   Shelda Pal, DO  ?spironolactone (ALDACTONE) 25 MG tablet Take 1 tablet (25 mg total) by mouth daily. 03/31/21   Shelda Pal, DO  ?vitamin B-12 (CYANOCOBALAMIN) 1000 MCG tablet Take 1,000 mcg by mouth daily.    [provider]  ?   ? ?Allergies    ?Patient has no known allergies.   ? ?Review of Systems   ?Review of Systems  ?Constitutional:  Negative for chills and fever.  ?Eyes:  Negative for visual disturbance.  ?Respiratory:  Positive for cough. Negative for shortness of breath.   ?Cardiovascular:  Negative for chest pain.  ?Gastrointestinal:  Negative  for abdominal pain, nausea and vomiting.  ?Genitourinary:  Negative for difficulty urinating and dysuria.  ?Musculoskeletal:  Negative for back pain and neck pain.  ?Skin:  Negative for color change and rash.  ?Neurological:  Negative for dizziness, syncope, light-headedness and headaches.  ?Psychiatric/Behavioral:  Negative for confusion.   ? ?Physical Exam ?Updated Vital Signs ?BP (!) 150/97   Pulse 82   Temp 98.2 ?F (36.8 ?C) (Oral)   Resp 20   Wt 128.4 kg   SpO2 98%   BMI 48.58 kg/m?  ?Physical Exam ? ?ED Results / Procedures / Treatments    ?Labs ?(all labs ordered are listed, but only abnormal results are displayed) ?Labs Reviewed - No data to display ? ?EKG ?None ? ?Radiology ?DG Chest 2 View ? ?Result Date: 09/22/2021 ?CLINICAL DATA:  Cough for 2 months, right ribcage inspiratory pain EXAM: CHEST - 2 VIEW COMPARISON:  10/23/2013 chest radiograph. FINDINGS: Stable cardiomediastinal silhouette with top-normal heart size. No pneumothorax. No pleural effusion. Lungs appear clear, with no acute consolidative airspace disease and no pulmonary edema. IMPRESSION: No active cardiopulmonary disease. Electronically Signed   By: Ilona Sorrel M.D.   On: 09/22/2021 16:10   ? ?Procedures ?Procedures  ? ? ?Medications Ordered in ED ?Medications  ?lidocaine (XYLOCAINE) 2 % viscous mouth solution 15 mL (has no administration in time range)  ? ? ?ED Course/ Medical Decision Making/ A&P ?  ?                        ?Medical Decision Making ?Amount and/or Complexity of Data Reviewed ?Radiology: ordered. ? ?Risk ?OTC drugs. ?Prescription drug management. ? ? ?Alert 51 year old female in no acute distress, nontoxic-appearing.  Presents to the emergency department with a chief complaint of right chest wall pain, tickle in throat, and cough. ? ?Information obtained from patient.  Past medical records were reviewed including previous divider notes, labs, and imaging.  Patient has past medical history as outlined in HPI which complicates her care. ? ?On physical exam patient has no swelling, exudate, or erythema to tonsils or oropharynx bilaterally.  Cobblestoning is noted to posterior oropharynx.  Patient able to handle all secretions without difficulty.  Able to tolerate p.o. intake without difficulty.  Neck is supple with full range of motion.  Low suspicion for deep space neck infection or strep pharyngitis at this time. ? ?Patient's lungs clear to auscultation bilaterally.  Due to prolonged nature of patient's cough will obtain chest x-ray to evaluate for possible  pneumonia as well as pneumothorax with patient's right-sided chest pain.  I personally viewed and interpreted patient's chest x-ray.  Imaging shows no active cardiopulmonary disease. ? ?Patient is chest wall pain is worse with movement, coughing, and touch.  Patient does have tenderness to palpation on exam.  Suspect that patient's pain is musculoskeletal in nature due to her frequent coughing.  Will obtain EKG due to patient's chest wall pain.  I personally viewed and interpreted patient's EKG.  Tracing shows sinus rhythm with no signs of STEMI. ? ?Patient reports improvement in her throat after receiving viscous lidocaine.  Will prescribe patient with viscous lidocaine due to reports of scratchy throat.  Will prescribe patient with Tessalon to help with her cough.  As patient has had nonproductive cough persistently for 2 months concern for possible GERD as a cause.  We will start patient on course of omeprazole.  Patient given information to follow-up closely with her PCP in the outpatient setting. ? ?  Discussed results, findings, treatment and follow up. Patient advised of return precautions. Patient verbalized understanding and agreed with plan. ? ? ? ? ? ? ? ? ? ?Final Clinical Impression(s) / ED Diagnoses ?Final diagnoses:  ?Subacute cough  ?Chest wall pain  ? ? ?Rx / DC Orders ?ED Discharge Orders   ? ?      Ordered  ?  lidocaine (XYLOCAINE) 2 % solution  As needed       ? 09/22/21 1918  ?  Lidocaine (HM LIDOCAINE PATCH) 4 % PTCH  Every 12 hours PRN       ? 09/22/21 1918  ?  omeprazole (PRILOSEC) 20 MG capsule  Daily       ? 09/22/21 1918  ? ?  ?  ? ?  ? ? ?  ?Loni Beckwith, PA-C ?09/22/21 2134 ? ?  ?Gareth Morgan, MD ?09/25/21 1716 ? ?

## 2021-09-22 NOTE — Telephone Encounter (Signed)
Called the patient and she was currently checking in at the ED. ?

## 2021-09-22 NOTE — Telephone Encounter (Signed)
Nurse Assessment ?Nurse: Sheppard Plumber, RN, Estill Bamberg Date/Time (Eastern Time): 09/22/2021 1:54:20 PM ?Confirm and document reason for call. If ?symptomatic, describe symptoms. ?---caller states she has had cough for over 2 weeks. ?pain under right breast started friday. one day last ?week and it felt like a knot on her right side. ?Does the patient have any new or worsening ?symptoms? ---Yes ?Will a triage be completed? ---Yes ?Related visit to physician within the last 2 weeks? ---No ?Does the PT have any chronic conditions? (i.e. ?diabetes, asthma, this includes High risk factors for ?pregnancy, etc.) ?---Yes ?List chronic conditions. ---allergies ?Is the patient pregnant or possibly pregnant? (Ask ?all females between the ages of 58-55) ---No ?Is this a behavioral health or substance abuse call? ---No ?Guidelines ?Guideline Title Affirmed Question Affirmed Notes Nurse Date/Time (Eastern ?Time) ?Cough - Acute ?Productive ?Chest pain ?(Exception: MILD ?central chest pain, ?Humfleet, RN, ?Estill Bamberg ?09/22/2021 1:57:37 ?PM ?PLEASE NOTE: All timestamps contained within this report are represented as [place] Standard Time. ?CONFIDENTIALTY NOTICE: This fax transmission is intended only for the addressee. It contains information that is legally privileged, confidential or ?otherwise protected from use or disclosure. If you are not the intended recipient, you are strictly prohibited from reviewing, disclosing, copying using ?or disseminating any of this information or taking any action in reliance on or regarding this information. If you have received this fax in error, please ?notify us immediately by telephone so that we can arrange for its return to [place]. Phone: [phone], Toll-Free: [phone], Fax: [phone] ?Page: 2 of 2 ?Call Id: [id number] ?Guidelines ?Guideline Title Affirmed Question Affirmed Notes Nurse Date/Time (Eastern ?Time) ?present only when ?coughing) ?Disp. Time (Eastern ?Time) Disposition Final User ?09/22/2021  1:51:46 PM Send to Urgent Queue Abigail Butts ?09/22/2021 2:00:55 PM Go to ED Now Yes Humfleet, RN, Estill Bamberg ?Caller Disagree/Comply Comply ?Caller Understands Yes ?PreDisposition Did not know what to do ?Care Advice Given Per Guideline ?GO TO ED NOW: * You need to be seen in the Emergency Department. * Go to the ED at ___________ [facility] [place] ?given per Cough - Acute Productive (Adult) guideline. ?Referrals ?[facility] - ED ?

## 2021-09-22 NOTE — ED Triage Notes (Signed)
COUGH X 2 MONTHS , DRY  MOUTH . RIGHT RIB CAGE PAIN WITH DEEP INSPIRATION AND MOTION .  ?

## 2021-09-22 NOTE — Discharge Instructions (Addendum)
You came to the emergency department today to be evaluated for your cough, throat tickle, and chest wall pain.  Your chest x-ray showed no signs of pneumonia or problems with your lungs.  Your EKG did not show any obvious signs of a heart attack.  Your physical exam was reassuring. ? ?Due to the tickle in your throat I given you prescription for viscous lidocaine.  Due to your cough I have given you prescription for Tessalon.  Due to your chest wall pain I have given you prescription for lidocaine patches.  As her cough has been chronic there is a concern that this may be due to acid reflux.  I have given you prescription for omeprazole. ? ?Please take Ibuprofen (Advil, motrin) and Tylenol (acetaminophen) to relieve your pain.   ? ?You may take up to 600 MG (3 pills) of normal strength ibuprofen every 8 hours as needed.   ?You make take tylenol, up to 1,000 mg (two extra strength pills) every 8 hours as needed.  ? ?It is safe to take ibuprofen and tylenol at the same time as they work differently.  ? Do not take more than 3,000 mg tylenol in a 24 hour period (not more than one dose every 8 hours.  Please check all medication labels as many medications such as pain and cold medications may contain tylenol.  Do not drink alcohol while taking these medications.  Do not take other NSAID'S while taking ibuprofen (such as aleve or naproxen).  Please take ibuprofen with food to decrease stomach upset. ? ?Please follow-up closely with your primary care provider for repeat evaluation. ? ?Get help right away if: ?You cough up blood. ?You have difficulty breathing. ?Your heartbeat is very fast. ?

## 2021-09-22 NOTE — Telephone Encounter (Signed)
Patient prescribed Tessalon due to persistent cough x2 months. ?

## 2021-10-07 ENCOUNTER — Other Ambulatory Visit: Payer: Self-pay | Admitting: Family Medicine

## 2021-10-07 DIAGNOSIS — I10 Essential (primary) hypertension: Secondary | ICD-10-CM

## 2021-12-29 ENCOUNTER — Encounter: Payer: Self-pay | Admitting: Family Medicine

## 2021-12-29 ENCOUNTER — Ambulatory Visit (INDEPENDENT_AMBULATORY_CARE_PROVIDER_SITE_OTHER): Payer: Commercial Managed Care - PPO | Admitting: Family Medicine

## 2021-12-29 VITALS — BP 118/80 | HR 58 | Temp 98.2°F | Ht 65.0 in | Wt 289.1 lb

## 2021-12-29 DIAGNOSIS — L609 Nail disorder, unspecified: Secondary | ICD-10-CM

## 2021-12-29 DIAGNOSIS — K219 Gastro-esophageal reflux disease without esophagitis: Secondary | ICD-10-CM

## 2021-12-29 DIAGNOSIS — M25552 Pain in left hip: Secondary | ICD-10-CM

## 2021-12-29 DIAGNOSIS — I10 Essential (primary) hypertension: Secondary | ICD-10-CM

## 2021-12-29 DIAGNOSIS — M7989 Other specified soft tissue disorders: Secondary | ICD-10-CM

## 2021-12-29 DIAGNOSIS — M25551 Pain in right hip: Secondary | ICD-10-CM

## 2021-12-29 MED ORDER — IBUPROFEN 800 MG PO TABS: ORAL_TABLET | ORAL | 2 refills | Status: DC

## 2021-12-29 NOTE — Patient Instructions (Addendum)
Keep the diet clean and stay active.  The only lifestyle changes that have data behind them are weight loss for the overweight/obese and elevating the head of the bed. Finding out which foods/positions are triggers is important.  Someone will reach out regarding your ultrasound.  If you do not hear anything about your referral in the next 1-2 weeks, call our office and ask for an update.  Let us know if you need anything.  Hip Exercises It is normal to feel mild stretching, pulling, tightness, or discomfort as you do these exercises, but you should stop right away if you feel sudden pain or your pain gets worse.   STRETCHING AND RANGE OF MOTION EXERCISES These exercises warm up your muscles and joints and improve the movement and flexibility of your hip. These exercises also help to relieve pain, numbness, and tingling. Exercise A: Hamstrings, Supine  Lie on your back. Loop a belt or towel over the ball of your left / right foot. The ball of your foot is on the walking surface, right under your toes. Straighten your left / right knee and slowly pull on the belt to raise your leg. Do not let your left / right knee bend while you do this. Keep your other leg flat on the floor. Raise the left / right leg until you feel a gentle stretch behind your left / right knee or thigh. Hold this position for 30 seconds. Slowly return your leg to the starting position. Repeat2 times. Complete this stretch 3 times per week. Exercise B: Hip Rotators  Lie on your back on a firm surface. Hold your left / right knee with your left / right hand. Hold your ankle with your other hand. Gently pull your left / right knee and rotate your lower leg toward your other shoulder. Pull until you feel a stretch in your buttocks. Keep your hips and shoulders firmly planted while you do this stretch. Hold this position for 30 seconds. Repeat 2 times. Complete this stretch 3 times per week. Exercise C: V-Sit  (Hamstrings and Adductors)  Sit on the floor with your legs extended in a large "V" shape. Keep your knees straight during this exercise. Start with your head and chest upright, then bend at your waist to reach for your left foot (position A). You should feel a stretch in your right inner thigh. Hold this position for 30 seconds. Then slowly return to the upright position. Bend at your waist to reach forward (position B). You should feel a stretch behind both of your thighs and knees. Hold this position for 30 seconds. Then slowly return to the upright position. Bend at your waist to reach for your right foot (position C). You should feel a stretch in your left inner thigh. Hold this position for 30 seconds. Then slowly return to the upright position. Repeat A, B, and C 2 times each. Complete this stretch 3 times per week. Exercise D: Lunge (Hip Flexors)  Place your left / right knee on the floor and bend your other knee so that is directly over your ankle. You should be half-kneeling. Keep good posture with your head over your shoulders. Tighten your buttocks to point your tailbone downward. This helps your back to keep from arching too much. You should feel a gentle stretch in the front of your left / right thigh and hip. If you do not feel any resistance, slightly slide your other foot forward and then slowly lunge forward so your knee  once again lines up over your ankle. Make sure your tailbone continues to point downward. Hold this position for 30 seconds. Repeat 2 times. Complete this stretch 3 times per week.  STRENGTHENING EXERCISES These exercises build strength and endurance in your hip. Endurance is the ability to use your muscles for a long time, even after they get tired. Exercise E: Bridge (Hip Extensors)  Lie on your back on a firm surface with your knees bent and your feet flat on the floor. Tighten your buttocks muscles and lift your bottom off the floor until the trunk of  your body is level with your thighs. Do not arch your back. You should feel the muscles working in your buttocks and the back of your thighs. If you do not feel these muscles, slide your feet 1-2 inches (2.5-5 cm) farther away from your buttocks. Hold this position for 3 seconds. Slowly lower your hips to the starting position. Repeat for a total of 10 repetitions. Let your muscles relax completely between repetitions. If this exercise is too easy, try doing it with your arms crossed over your chest. Repeat 2 times. Complete this exercise 3 times per week. Exercise F: Straight Leg Raises - Hip Abductors  Lie on your side with your left / right leg in the top position. Lie so your head, shoulder, knee, and hip line up with each other. You may bend your bottom knee to help you balance. Roll your hips slightly forward, so your hips are stacked directly over each other and your left / right knee is facing forward. Leading with your heel, lift your top leg 4-6 inches (10-15 cm). You should feel the muscles in your outer hip lifting. Do not let your foot drift forward. Do not let your knee roll toward the ceiling. Hold this position for 1 second. Slowly return to the starting position. Let your muscles relax completely between repetitions. Repeat for a total of 10 repetitions.  Repeat 2 times. Complete this exercise 3 times per week. Exercise G: Straight Leg Raises - Hip Adductors  Lie on your side with your left / right leg in the bottom position. Lie so your head, shoulder, knee, and hip line up. You may place your upper foot in front to help you balance. Roll your hips slightly forward, so your hips are stacked directly over each other and your left / right knee is facing forward. Tense the muscles in your inner thigh and lift your bottom leg 4-6 inches (10-15 cm). Hold this position for 1 second. Slowly return to the starting position. Let your muscles relax completely between repetitions.  Repeat for a total of 10 repetitions. Repeat 2 times. Complete this exercise 3 times per week. Exercise H: Straight Leg Raises - Quadriceps  Lie on your back with your left / right leg extended and your other knee bent. Tense the muscles in the front of your left / right thigh. When you do this, you should see your kneecap slide up or see increased dimpling just above your knee. Tighten these muscles even more and raise your leg 4-6 inches (10-15 cm) off the floor. Hold this position for 3 seconds. Keep these muscles tense as you lower your leg. Relax the muscles slowly and completely between repetitions. Repeat for a total of 10 repetitions. Repeat 2 times. Complete this exercise 3 times per week. Exercise I: Hip Abductors, Standing Tie one end of a rubber exercise band or tubing to a secure surface, such as a table  or pole. Loop the other end of the band or tubing around your left / right ankle. Keeping your ankle with the band or tubing directly opposite of the secured end, step away until there is tension in the tubing or band. Hold onto a chair as needed for balance. Lift your left / right leg out to your side. While you do this: Keep your back upright. Keep your shoulders over your hips. Keep your toes pointing forward. Make sure to use your hip muscles to lift your leg. Do not "throw" your leg or tip your body to lift your leg. Hold this position for 1 second. Slowly return to the starting position. Repeat for a total of 10 repetitions. Repeat 2 times. Complete this exercise 3 times per week. Exercise J: Squats (Quadriceps) Stand in a door frame so your feet and knees are in line with the frame. You may place your hands on the frame for balance. Slowly bend your knees and lower your hips like you are going to sit in a chair. Keep your lower legs in a straight-up-and-down position. Do not let your hips go lower than your knees. Do not bend your knees lower than told by your health  care provider. If your hip pain increases, do not bend as low. Hold this position for 1 second. Slowly push with your legs to return to standing. Do not use your hands to pull yourself to standing. Repeat for a total of 10 repetitions. Repeat 2 times. Complete this exercise 3 times per week. Make sure you discuss any questions you have with your health care provider. Document Released: 06/19/2005 Document Revised: 02/24/2016 Document Reviewed: 05/27/2015 Elsevier Interactive Patient Education  [employer].

## 2021-12-29 NOTE — Progress Notes (Signed)
Chief Complaint  Patient presents with   Follow-up    Toe nail fungus Stomach pain     Subjective Tiffany Peterson is a 51 y.o. female who presents for hypertension follow up. She does monitor home blood pressures. Blood pressures ranging from 110-120's/70-80's on average. She is compliant with medications- Norvasc 5 mg/d, lisinopril 20 mg/d, spironolactone 25 mg/d. Patient has these side effects of medication: none She is not always adhering to a healthy diet overall. Current exercise: none No CP or SOB.  GERD Hx of GERD on Nexium 20 mg/d prn. Reports compliance, no AE's. Denies dysphagia, bleeding, unintentional wt loss.   LLE  She has noticed a protuberance over her left lower extremity.  There is no pain.  She does not feel it is growing.  There is nothing draining from it and no color changes.  No new topicals.  The nails on both feet are thickened and she would like to see a specialist.  She has a history of bilateral hip pain.  No injury or change in activity.  The pain is mainly in the front of her hips.  No catching or locking, bruising, swelling, or redness.  She denies any neurologic signs or symptoms.  She does not exercise routinely.  She has not tried anything at home so far.  Past Medical History:  Diagnosis Date   Anemia    Essential hypertension 08/19/2016   Morbid obesity ([place])    Vaginal Pap smear, abnormal    colposcopy    Exam BP 118/80   Pulse (!) 58   Temp 98.2 F (36.8 C) (Oral)   Ht '5\' 5"'$  (1.651 m)   Wt 289 lb 2 oz (131.1 kg)   SpO2 97%   BMI 48.11 kg/m  General:  well developed, well nourished, in no apparent distress Heart: RRR, no bruits, no LE edema Lungs: clear to auscultation, no accessory muscle use Skin: Prominence of soft tissue (lipoma?)  Over medial/proximal left lower extremity Over both feet, her nails are thickened, yellowed, and dystrophic without obvious ingrown nails MSK: TTP over the hip flexors bilaterally, no  catching/locking of the joint, normal active/passive range of motion, negative logroll Neuro: Gait is overall normal for body habitus Psych: well oriented with normal range of affect and appropriate judgment/insight  Essential hypertension  Gastroesophageal reflux disease, unspecified whether esophagitis present  Morbid obesity ([place]), Chronic  Mass of soft tissue of lower extremity - Plan: [place] LT LOWER EXTREM LTD SOFT TISSUE NON VASCULAR  Nail problem - Plan: Ambulatory referral to Podiatry  Bilateral hip pain  Chronic, stable. Cont Norvasc 10 mg/d, lisinopril 20 mg/d, Aldactone 25 mg/d. Counseled on diet and exercise. Chronic, stable. Cont Nexium 20 mg/d.  Counseled on diet and exercise, she can improve on both at this time. Feels like a lipoma, could be an accumulation of body fat.  We will check an ultrasound for more information. Refer to podiatry. Stretches and exercises provided.  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 F/u in 6 mo or prn. The patient voiced understanding and agreement to the plan.  I spent 40 minutes with the patient discussing the above several plans in addition to reviewing her chart on the same day of the visit.  [place], DO 12/29/21  3:23 PM

## 2021-12-31 ENCOUNTER — Ambulatory Visit (HOSPITAL_BASED_OUTPATIENT_CLINIC_OR_DEPARTMENT_OTHER)
Admission: RE | Admit: 2021-12-31 | Discharge: 2021-12-31 | Disposition: A | Discharge status: Home / Self Care | Payer: Commercial Managed Care - PPO | Source: Ambulatory Visit | Attending: Family Medicine | Admitting: Family Medicine

## 2021-12-31 DIAGNOSIS — M7989 Other specified soft tissue disorders: Secondary | ICD-10-CM | POA: Diagnosis present

## 2022-01-12 ENCOUNTER — Ambulatory Visit (INDEPENDENT_AMBULATORY_CARE_PROVIDER_SITE_OTHER): Payer: Commercial Managed Care - PPO | Admitting: Podiatry

## 2022-01-12 DIAGNOSIS — B353 Tinea pedis: Secondary | ICD-10-CM | POA: Diagnosis not present

## 2022-01-12 DIAGNOSIS — B351 Tinea unguium: Secondary | ICD-10-CM | POA: Diagnosis not present

## 2022-01-12 MED ORDER — EFINACONAZOLE 10 % EX SOLN: 1.0000 [drp] | Freq: Every day | CUTANEOUS | 11 refills | Status: DC

## 2022-01-12 MED ORDER — CLOTRIMAZOLE-BETAMETHASONE 1-0.05 % EX CREA: 1.0000 | TOPICAL_CREAM | Freq: Two times a day (BID) | CUTANEOUS | 0 refills | Status: DC

## 2022-01-12 NOTE — Progress Notes (Unsigned)
Subjective:   Patient ID: Tiffany Peterson, female   DOB: 51 y.o.   MRN: [id number]   HPI Chief Complaint  Patient presents with   Nail Problem    Bilateral Nail fungus, started 3+ years ago, pt is having itching and dark  thick nails, nail on the hallux has came of twice, pt also has fungus in between the 4th and 5th toes, pt skin is very dry and peeling   51 year old female presents the above complaints.  She states that she recently bumped her toenail and then came off.  Nails are coming thick and discolored and she also gets fungus between the fourth and fifth toes.  She has been keeping Vaseline as well as moisturizer between her fourth and fifth toes of the skin gets dry.  She has a small cut on the bottom of her left fifth toe which is tender.  She is also told it is an over-the-counter medication for the nails but she never did get this.  No swelling redness or drainage at the toenail sites.  Review of Systems  All other systems reviewed and are negative.  Past Medical History:  Diagnosis Date   Anemia    Essential hypertension 08/19/2016   Morbid obesity ([place])    Vaginal Pap smear, abnormal    colposcopy    Past Surgical History:  Procedure Laterality Date   DILITATION & CURRETTAGE/HYSTROSCOPY WITH NOVASURE ABLATION N/A 08/13/2020   Procedure: DILATATION & CURETTAGE/HYSTEROSCOPY WITH NOVASURE ABLATION, paracervical block;  Surgeon: [name], MD;  Location: [facility];  Service: Gynecology;  Laterality: N/A;   IUD REMOVAL  08/13/2020   Procedure: INTRAUTERINE DEVICE (IUD) REMOVAL;  Surgeon: [name], MD;  Location: [facility];  Service: Gynecology;;   KNEE ARTHROSCOPY WITH EXCISION BAKER'S CYST     TUBAL LIGATION       Current Outpatient Medications:    clotrimazole-betamethasone (LOTRISONE) cream, Apply 1 Application topically 2 (two) times daily., Disp: 30 g, Rfl: 0   Efinaconazole 10 % SOLN, Apply 1 drop topically daily., Disp: 4 mL, Rfl: 11   amLODipine  (NORVASC) 5 MG tablet, Take 1 tablet (5 mg total) by mouth daily., Disp: 90 tablet, Rfl: 1   APPLE CIDER VINEGAR PO, Take 3 capsules by mouth in the morning and at bedtime., Disp: , Rfl:    cyclobenzaprine (FLEXERIL) 10 MG tablet, Take 10 mg by mouth 3 (three) times daily as needed for muscle spasms., Disp: , Rfl:    esomeprazole (NEXIUM) 20 MG capsule, Take 1 capsule (20 mg total) by mouth daily as needed (acid reflux)., Disp: 90 capsule, Rfl: 1   ibuprofen (ADVIL) 800 MG tablet, TAKE 1 TABLET(800 MG) BY MOUTH EVERY 8 HOURS AS NEEDED FOR MODERATE PAIN, Disp: 30 tablet, Rfl: 2   lisinopril (ZESTRIL) 20 MG tablet, Take 1 tablet (20 mg total) by mouth daily., Disp: 90 tablet, Rfl: 1   omeprazole (PRILOSEC) 20 MG capsule, Take 1 capsule (20 mg total) by mouth daily., Disp: 30 capsule, Rfl: 0   spironolactone (ALDACTONE) 25 MG tablet, TAKE 1 TABLET(25 MG) BY MOUTH DAILY, Disp: 90 tablet, Rfl: 1   vitamin B-12 (CYANOCOBALAMIN) 1000 MCG tablet, Take 1,000 mcg by mouth daily., Disp: , Rfl:   No Known Allergies        Objective:  Physical Exam   General: AAO x3, NAD  Dermatological: Flat, annular, mole right midfoot arch since kid, no change she report (this started after she stepped on a nail as a kid) Nails are  hypertrophic, yellow discoloration.  No hyperpigmentation.  There is maceration along the fourth interspace bilaterally.  Small superficial cut on the sulcus of the left fifth toe without any drainage or pus.  There is no open lesions.   Vascular: Dorsalis Pedis artery and Posterior Tibial artery pedal pulses are 2/4 bilateral with immedate capillary fill time. There is no pain with calf compression, swelling, warmth, erythema.   Neruologic: Grossly intact via light touch bilateral.   Musculoskeletal: No pain.  Muscular strength 5/5 in all groups tested bilateral.  Gait: Unassisted, Nonantalgic.      Assessment:   Onychomycosis, skin maceration, superficial skin breakdown left  fifth toe     Plan:  -Treatment options discussed including all alternatives, risks, and complications -Etiology of symptoms were discussed -I advised her not to apply Vaseline or moisturizer between the toes.  Recommended drying well between the toes.  We discussed oral, topical as well as alternative treatments with fungus.  She was to do topical medication.  I prescribed Lotrisone cream as well as Jublia.  Once we clear this out and then moisturizer for the dry skin if needed that was present the plantar aspect of the feet. -Small amount of antibiotic on the left fifth toe.  Monitor for any signs or symptoms of infection.  Trula Slade DPM

## 2022-01-12 NOTE — Patient Instructions (Signed)
Athlete's Foot  Athlete's foot (tinea pedis) is a fungal infection of the skin on your feet. It often occurs on the skin that is between or underneath your toes. It can also occur on the soles of your feet. Symptoms include itchy or white and flaky areas on the skin. The infection can spread from person to person (is contagious). It can also spread when a person's bare feet come in contact with the fungus on shower floors or on items such as shoes. Follow these instructions at home: Medicines Apply or take over-the-counter and prescription medicines only as told by your doctor. Apply your antifungal medicine as told by your doctor. Do not stop using it even if your feet start to get better. Foot care Do not scratch your feet. Keep your feet dry: Wear cotton or wool socks. Change your socks every day or if they become wet. Wear shoes that allow air to move around, such as sandals or canvas tennis shoes. Wash and dry your feet: Every day or as told by your doctor. After exercising. Including the area between your toes. General instructions Do not share any of these items that touch your feet: Towels. Shoes. Nail clippers. Other personal items. Protect your feet by wearing sandals in wet areas, such as locker rooms and shared showers. Keep all follow-up visits. If you have diabetes, keep your blood sugar under control. Contact a doctor if: You have a fever. You have swelling, pain, warmth, or redness in your foot. Your feet are not getting better with treatment. Your symptoms get worse. You have new symptoms. You have very bad pain. Summary Athlete's foot is a fungal infection of the skin on your feet. This condition is caused by a fungus that grows in warm, moist places. Symptoms include itchy or white and flaky areas on the skin. Apply your antifungal medicine as told by your doctor. Keep your feet clean and dry. This information is not intended to replace advice given to you by  your health care provider. Make sure you discuss any questions you have with your health care provider. Document Revised: 09/22/2020 Document Reviewed: 09/22/2020 Elsevier Patient Education  2023 [employer]  

## 2022-01-13 ENCOUNTER — Telehealth: Payer: Self-pay | Admitting: Podiatry

## 2022-01-13 ENCOUNTER — Other Ambulatory Visit: Payer: Self-pay | Admitting: Podiatry

## 2022-01-13 DIAGNOSIS — Z79899 Other long term (current) drug therapy: Secondary | ICD-10-CM

## 2022-01-13 NOTE — Telephone Encounter (Signed)
Patient called back she wants to do the pills instead of the cream.  Can you call medication into  [facility] on [address]  ?

## 2022-01-19 NOTE — Telephone Encounter (Signed)
Left message on patient voicemail letting her know that she needs to have lab work done first.

## 2022-03-20 ENCOUNTER — Telehealth: Payer: Self-pay | Admitting: Family Medicine

## 2022-03-20 DIAGNOSIS — I10 Essential (primary) hypertension: Secondary | ICD-10-CM

## 2022-03-20 MED ORDER — AMLODIPINE BESYLATE 5 MG PO TABS: 5.0000 mg | ORAL_TABLET | Freq: Every day | ORAL | 1 refills | Status: DC

## 2022-03-20 MED ORDER — SPIRONOLACTONE 25 MG PO TABS: ORAL_TABLET | ORAL | 1 refills | Status: DC

## 2022-03-20 NOTE — Telephone Encounter (Signed)
Medication: spironolactone (ALDACTONE) 25 MG tablet   amLODipine (NORVASC) 5 MG tablet   metoprolol succinate (TOPROL-XL) 50 MG 24 hr tablet [[id number]]   Has the patient contacted their pharmacy? No.   Preferred Pharmacy (with phone number or street name): [id number] - [place], [address], [place] [address] Phone: [phone]  Fax: [phone]   Agent: Please be advised that RX refills may take up to 3 business days. We ask that you follow-up with your pharmacy.

## 2022-03-20 NOTE — Telephone Encounter (Signed)
Sent in as requested 

## 2022-11-26 ENCOUNTER — Telehealth: Payer: Self-pay | Admitting: Family Medicine

## 2022-11-26 NOTE — Telephone Encounter (Signed)
Initial Comment Caller states they have pain in left arm and unknown cause with leg pain and numbness. Translation No Nurse Assessment Nurse: Nunzio Cory, RN, Sherrie Date/Time (Eastern Time): 11/26/2022 3:38:51 PM Confirm and document reason for call. If symptomatic, describe symptoms. ---Caller states having pain in left arm. States left hand goes numb when sleeping. States also with numbness in right leg, pain with working. When up and active will get pain in both legs. States also with back pain using BC powders, Tylenol and Ibuprofen. These meds do not help with the pain in left arm. Denies any CP or SOB. Does the patient have any new or worsening symptoms? ---Yes Will a triage be completed? ---Yes Related visit to physician within the last 2 weeks? ---No Does the PT have any chronic conditions? (i.e. diabetes, asthma, this includes High risk factors for pregnancy, etc.) ---Yes List chronic conditions. ---HTN Is the patient pregnant or possibly pregnant? (Ask all females between the ages of 59-55) ---No Is this a behavioral health or substance abuse call? ---No Guidelines Guideline Title Affirmed Question Affirmed Notes Nurse Date/Time Lamount Cohen Time) Shoulder Pain Numbness (i.e., loss of sensation) in hand or fingers Nunzio Cory, RN, Sherrie 11/26/2022 3:47:32 PM PLEASE NOTE: All timestamps contained within this report are represented as [place] Standard Time. CONFIDENTIALTY NOTICE: This fax transmission is intended only for the addressee. It contains information that is legally privileged, confidential or otherwise protected from use or disclosure. If you are not the intended recipient, you are strictly prohibited from reviewing, disclosing, copying using or disseminating any of this information or taking any action in reliance on or regarding this information. If you have received this fax in error, please notify us immediately by telephone so that we can arrange for its return to [place].  Phone: [phone], Toll-Free: [phone], Fax: [phone] Page: 2 of 2 Call Id: [id number] Disp. Time Lamount Cohen Time) Disposition Final User 11/26/2022 3:53:16 PM See PCP within 24 Hours Yes Nunzio Cory, RN, Sherrie Final Disposition 11/26/2022 3:53:16 PM See PCP within 24 Hours Yes Nunzio Cory, RN, Sherrie Caller Disagree/Comply Comply Caller Understands Yes PreDisposition Go to ED Care Advice Given Per Guideline SEE PCP WITHIN 24 HOURS: * IF OFFICE WILL BE OPEN: You need to be examined within the next 24 hours. Call your doctor (or NP/PA) when the office opens and make an appointment. CALL BACK IF: * You become worse Comments User: Holland Commons, RN Date/Time (Eastern Time): 11/26/2022 3:45:23 PM States more left shoulder that radiates down left arm. User: Holland Commons, RN Date/Time (Eastern Time): 11/26/2022 3:47:07 PM States a lot of lifing, carrying, and pulling at job. States started this job in January. States pain in right shoulder started the 4th day after starting the job. User: Holland Commons, RN Date/Time (Eastern Time): 11/26/2022 3:50:25 PM Pain at present 7/10. States when lies down the pain gets severe. Referrals REFERRED TO PCP OFFICE

## 2022-11-26 NOTE — Telephone Encounter (Signed)
Appt scheduled w/ PCP.  

## 2022-11-26 NOTE — Telephone Encounter (Signed)
FYI: This call has been transferred to Access Nurse. Once the result note has been entered staff can address the message at that time.  Patient called in with the following symptoms:  Red Word: Pain, Weakness, Numbness in arms and legs   Please advise at Mobile [phone] (mobile)  Message is routed to Provider Pool and [facility] Triage

## 2022-11-27 ENCOUNTER — Ambulatory Visit: Payer: BC Managed Care – PPO | Admitting: Family Medicine

## 2022-11-27 ENCOUNTER — Encounter: Payer: Self-pay | Admitting: Family Medicine

## 2022-11-27 VITALS — BP 148/92 | HR 60 | Temp 98.0°F | Ht 65.0 in | Wt 267.1 lb

## 2022-11-27 DIAGNOSIS — I10 Essential (primary) hypertension: Secondary | ICD-10-CM | POA: Diagnosis not present

## 2022-11-27 DIAGNOSIS — Z1231 Encounter for screening mammogram for malignant neoplasm of breast: Secondary | ICD-10-CM

## 2022-11-27 DIAGNOSIS — M25512 Pain in left shoulder: Secondary | ICD-10-CM

## 2022-11-27 DIAGNOSIS — G8929 Other chronic pain: Secondary | ICD-10-CM

## 2022-11-27 DIAGNOSIS — R200 Anesthesia of skin: Secondary | ICD-10-CM

## 2022-11-27 DIAGNOSIS — M545 Low back pain, unspecified: Secondary | ICD-10-CM | POA: Diagnosis not present

## 2022-11-27 MED ORDER — LISINOPRIL 20 MG PO TABS: 20.0000 mg | ORAL_TABLET | Freq: Every day | ORAL | 1 refills | Status: DC

## 2022-11-27 MED ORDER — AMLODIPINE BESYLATE 5 MG PO TABS: 5.0000 mg | ORAL_TABLET | Freq: Every day | ORAL | 1 refills | Status: DC

## 2022-11-27 MED ORDER — MELOXICAM 15 MG PO TABS
15.0000 mg | ORAL_TABLET | Freq: Every day | ORAL | 0 refills | Status: AC
Start: 2022-11-27 — End: ?

## 2022-11-27 MED ORDER — TIZANIDINE HCL 4 MG PO TABS
4.0000 mg | ORAL_TABLET | Freq: Four times a day (QID) | ORAL | 0 refills | Status: DC | PRN
Start: 2022-11-27 — End: 2023-09-06

## 2022-11-27 NOTE — Patient Instructions (Signed)
Heat (pad or rice pillow in microwave) over affected area, 10-15 minutes twice daily.   Ice/cold pack over area for 10-15 min twice daily.  OK to take Tylenol 1000 mg (2 extra strength tabs) or 975 mg (3 regular strength tabs) every 6 hours as needed.  Let us know if you need anything.  EXERCISES  RANGE OF MOTION (ROM) AND STRETCHING EXERCISES These exercises may help you when beginning to rehabilitate your injury. While completing these exercises, remember:  Restoring tissue flexibility helps normal motion to return to the joints. This allows healthier, less painful movement and activity. An effective stretch should be held for at least 30 seconds. A stretch should never be painful. You should only feel a gentle lengthening or release in the stretched tissue.  ROM - Pendulum Bend at the waist so that your right / left arm falls away from your body. Support yourself with your opposite hand on a solid surface, such as a table or a countertop. Your right / left arm should be perpendicular to the ground. If it is not perpendicular, you need to lean over farther. Relax the muscles in your right / left arm and shoulder as much as possible. Gently sway your hips and trunk so they move your right / left arm without any use of your right / left shoulder muscles. Progress your movements so that your right / left arm moves side to side, then forward and backward, and finally, both clockwise and counterclockwise. Complete 10-15 repetitions in each direction. Many people use this exercise to relieve discomfort in their shoulder as well as to gain range of motion. Repeat 2 times. Complete this exercise 3 times per week.  STRETCH - Flexion, Standing Stand with good posture. With an underhand grip on your right / left hand and an overhand grip on the opposite hand, grasp a broomstick or cane so that your hands are a little more than shoulder-width apart. Keeping your right / left elbow straight and  shoulder muscles relaxed, push the stick with your opposite hand to raise your right / left arm in front of your body and then overhead. Raise your arm until you feel a stretch in your right / left shoulder, but before you have increased shoulder pain. Try to avoid shrugging your right / left shoulder as your arm rises by keeping your shoulder blade tucked down and toward your mid-back spine. Hold 30 seconds. Slowly return to the starting position. Repeat 2 times. Complete this exercise 3 times per week.  STRETCH - Internal Rotation Place your right / left hand behind your back, palm-up. Throw a towel or belt over your opposite shoulder. Grasp the towel/belt with your right / left hand. While keeping an upright posture, gently pull up on the towel/belt until you feel a stretch in the front of your right / left shoulder. Avoid shrugging your right / left shoulder as your arm rises by keeping your shoulder blade tucked down and toward your mid-back spine. Hold 30. Release the stretch by lowering your opposite hand. Repeat 2 times. Complete this exercise 3 times per week.  STRETCH - External Rotation and Abduction Stagger your stance through a doorframe. It does not matter which foot is forward. As instructed by your physician, physical therapist or athletic trainer, place your hands: And forearms above your head and on the door frame. And forearms at head-height and on the door frame. At elbow-height and on the door frame. Keeping your head and chest upright and your  stomach muscles tight to prevent over-extending your low-back, slowly shift your weight onto your front foot until you feel a stretch across your chest and/or in the front of your shoulders. Hold 30 seconds. Shift your weight to your back foot to release the stretch. Repeat 2 times. Complete this stretch 3 times per week.   STRENGTHENING EXERCISES  These exercises may help you when beginning to rehabilitate your injury. They may  resolve your symptoms with or without further involvement from your physician, physical therapist or athletic trainer. While completing these exercises, remember:  Muscles can gain both the endurance and the strength needed for everyday activities through controlled exercises. Complete these exercises as instructed by your physician, physical therapist or athletic trainer. Progress the resistance and repetitions only as guided. You may experience muscle soreness or fatigue, but the pain or discomfort you are trying to eliminate should never worsen during these exercises. If this pain does worsen, stop and make certain you are following the directions exactly. If the pain is still present after adjustments, discontinue the exercise until you can discuss the trouble with your clinician. If advised by your physician, during your recovery, avoid activity or exercises which involve actions that place your right / left hand or elbow above your head or behind your back or head. These positions stress the tissues which are trying to heal.  STRENGTH - Scapular Depression and Adduction With good posture, sit on a firm chair. Supported your arms in front of you with pillows, arm rests or a table top. Have your elbows in line with the sides of your body. Gently draw your shoulder blades down and toward your mid-back spine. Gradually increase the tension without tensing the muscles along the top of your shoulders and the back of your neck. Hold for 3 seconds. Slowly release the tension and relax your muscles completely before completing the next repetition. After you have practiced this exercise, remove the arm support and complete it in standing as well as sitting. Repeat 2 times. Complete this exercise 3 times per week.   STRENGTH - External Rotators Secure a rubber exercise band/tubing to a fixed object so that it is at the same height as your right / left elbow when you are standing or sitting on a firm  surface. Stand or sit so that the secured exercise band/tubing is at your side that is not injured. Bend your elbow 90 degrees. Place a folded towel or small pillow under your right / left arm so that your elbow is a few inches away from your side. Keeping the tension on the exercise band/tubing, pull it away from your body, as if pivoting on your elbow. Be sure to keep your body steady so that the movement is only coming from your shoulder rotating. Hold 3 seconds. Release the tension in a controlled manner as you return to the starting position. Repeat 2 times. Complete this exercise 3 times per week.   STRENGTH - Supraspinatus Stand or sit with good posture. Grasp a 2-3 lb weight or an exercise band/tubing so that your hand is "thumbs-up," like when you shake hands. Slowly lift your right / left hand from your thigh into the air, traveling about 30 degrees from straight out at your side. Lift your hand to shoulder height or as far as you can without increasing any shoulder pain. Initially, many people do not lift their hands above shoulder height. Avoid shrugging your right / left shoulder as your arm rises by keeping your  shoulder blade tucked down and toward your mid-back spine. Hold for 3 seconds. Control the descent of your hand as you slowly return to your starting position. Repeat 2 times. Complete this exercise 3 times per week.   STRENGTH - Shoulder Extensors Secure a rubber exercise band/tubing so that it is at the height of your shoulders when you are either standing or sitting on a firm arm-less chair. With a thumbs-up grip, grasp an end of the band/tubing in each hand. Straighten your elbows and lift your hands straight in front of you at shoulder height. Step back away from the secured end of band/tubing until it becomes tense. Squeezing your shoulder blades together, pull your hands down to the sides of your thighs. Do not allow your hands to go behind you. Hold for 3 seconds.  Slowly ease the tension on the band/tubing as you reverse the directions and return to the starting position. Repeat 2 times. Complete this exercise 3 times per week.   STRENGTH - Scapular Retractors Secure a rubber exercise band/tubing so that it is at the height of your shoulders when you are either standing or sitting on a firm arm-less chair. With a palm-down grip, grasp an end of the band/tubing in each hand. Straighten your elbows and lift your hands straight in front of you at shoulder height. Step back away from the secured end of band/tubing until it becomes tense. Squeezing your shoulder blades together, draw your elbows back as you bend them. Keep your upper arm lifted away from your body throughout the exercise. Hold 3 seconds. Slowly ease the tension on the band/tubing as you reverse the directions and return to the starting position. Repeat 2 times. Complete this exercise 3 times per week.  STRENGTH - Scapular Depressors Find a sturdy chair without wheels, such as a from a dining room table. Keeping your feet on the floor, lift your bottom from the seat and lock your elbows. Keeping your elbows straight, allow gravity to pull your body weight down. Your shoulders will rise toward your ears. Raise your body against gravity by drawing your shoulder blades down your back, shortening the distance between your shoulders and ears. Although your feet should always maintain contact with the floor, your feet should progressively support less body weight as you get stronger. Hold 3 seconds. In a controlled and slow manner, lower your body weight to begin the next repetition. Repeat 2 times. Complete this exercise 3 times per week.    This information is not intended to replace advice given to you by your health care provider. Make sure you discuss any questions you have with your health care provider.   Document Released: 04/15/2005 Document Revised: 06/22/2014 Document Reviewed:  09/13/2008 [employer] Interactive Patient Education 2016 [employer]  EXERCISES  RANGE OF MOTION (ROM) AND STRETCHING EXERCISES - Low Back Pain Most people with lower back pain will find that their symptoms get worse with excessive bending forward (flexion) or arching at the lower back (extension). The exercises that will help resolve your symptoms will focus on the opposite motion.  If you have pain, numbness or tingling which travels down into your buttocks, leg or foot, the goal of the therapy is for these symptoms to move closer to your back and eventually resolve. Sometimes, these leg symptoms will get better, but your lower back pain may worsen. This is often an indication of progress in your rehabilitation. Be very alert to any changes in your symptoms and the activities in which  you participated in the 24 hours prior to the change. Sharing this information with your caregiver will allow him or her to most efficiently treat your condition. These exercises may help you when beginning to rehabilitate your injury. Your symptoms may resolve with or without further involvement from your physician, physical therapist or athletic trainer. While completing these exercises, remember:  Restoring tissue flexibility helps normal motion to return to the joints. This allows healthier, less painful movement and activity. An effective stretch should be held for at least 30 seconds. A stretch should never be painful. You should only feel a gentle lengthening or release in the stretched tissue. FLEXION RANGE OF MOTION AND STRETCHING EXERCISES:  STRETCH - Flexion, Single Knee to Chest  Lie on a firm bed or floor with both legs extended in front of you. Keeping one leg in contact with the floor, bring your opposite knee to your chest. Hold your leg in place by either grabbing behind your thigh or at your knee. Pull until you feel a gentle stretch in your low back. Hold 30 seconds. Slowly release your grasp and  repeat the exercise with the opposite side. Repeat 2 times. Complete this exercise 3 times per week.   STRETCH - Flexion, Double Knee to Chest Lie on a firm bed or floor with both legs extended in front of you. Keeping one leg in contact with the floor, bring your opposite knee to your chest. Tense your stomach muscles to support your back and then lift your other knee to your chest. Hold your legs in place by either grabbing behind your thighs or at your knees. Pull both knees toward your chest until you feel a gentle stretch in your low back. Hold 30 seconds. Tense your stomach muscles and slowly return one leg at a time to the floor. Repeat 2 times. Complete this exercise 3 times per week.   STRETCH - Low Trunk Rotation Lie on a firm bed or floor. Keeping your legs in front of you, bend your knees so they are both pointed toward the ceiling and your feet are flat on the floor. Extend your arms out to the side. This will stabilize your upper body by keeping your shoulders in contact with the floor. Gently and slowly drop both knees together to one side until you feel a gentle stretch in your low back. Hold for 30 seconds. Tense your stomach muscles to support your lower back as you bring your knees back to the starting position. Repeat the exercise to the other side. Repeat 2 times. Complete this exercise at least 3 times per week.   EXTENSION RANGE OF MOTION AND FLEXIBILITY EXERCISES:  STRETCH - Extension, Prone on Elbows  Lie on your stomach on the floor, a bed will be too soft. Place your palms about shoulder width apart and at the height of your head. Place your elbows under your shoulders. If this is too painful, stack pillows under your chest. Allow your body to relax so that your hips drop lower and make contact more completely with the floor. Hold this position for 30 seconds. Slowly return to lying flat on the floor. Repeat 2 times. Complete this exercise 3 times per week.    RANGE OF MOTION - Extension, Prone Press Ups Lie on your stomach on the floor, a bed will be too soft. Place your palms about shoulder width apart and at the height of your head. Keeping your back as relaxed as possible, slowly straighten your elbows while  keeping your hips on the floor. You may adjust the placement of your hands to maximize your comfort. As you gain motion, your hands will come more underneath your shoulders. Hold this position 30 seconds. Slowly return to lying flat on the floor. Repeat 2 times. Complete this exercise 3 times per week.   RANGE OF MOTION- Quadruped, Neutral Spine  Assume a hands and knees position on a firm surface. Keep your hands under your shoulders and your knees under your hips. You may place padding under your knees for comfort. Drop your head and point your tailbone toward the ground below you. This will round out your lower back like an angry cat. Hold this position for 30 seconds. Slowly lift your head and release your tail bone so that your back sags into a large arch, like an old horse. Hold this position for 30 seconds. Repeat this until you feel limber in your low back. Now, find your "sweet spot." This will be the most comfortable position somewhere between the two previous positions. This is your neutral spine. Once you have found this position, tense your stomach muscles to support your low back. Hold this position for 30 seconds. Repeat 2 times. Complete this exercise 3 times per week.   STRENGTHENING EXERCISES - Low Back Sprain These exercises may help you when beginning to rehabilitate your injury. These exercises should be done near your "sweet spot." This is the neutral, low-back arch, somewhere between fully rounded and fully arched, that is your least painful position. When performed in this safe range of motion, these exercises can be used for people who have either a flexion or extension based injury. These exercises may resolve your  symptoms with or without further involvement from your physician, physical therapist or athletic trainer. While completing these exercises, remember:  Muscles can gain both the endurance and the strength needed for everyday activities through controlled exercises. Complete these exercises as instructed by your physician, physical therapist or athletic trainer. Increase the resistance and repetitions only as guided. You may experience muscle soreness or fatigue, but the pain or discomfort you are trying to eliminate should never worsen during these exercises. If this pain does worsen, stop and make certain you are following the directions exactly. If the pain is still present after adjustments, discontinue the exercise until you can discuss the trouble with your caregiver.  STRENGTHENING - Deep Abdominals, Pelvic Tilt  Lie on a firm bed or floor. Keeping your legs in front of you, bend your knees so they are both pointed toward the ceiling and your feet are flat on the floor. Tense your lower abdominal muscles to press your low back into the floor. This motion will rotate your pelvis so that your tail bone is scooping upwards rather than pointing at your feet or into the floor. With a gentle tension and even breathing, hold this position for 3 seconds. Repeat 2 times. Complete this exercise 3 times per week.   STRENGTHENING - Abdominals, Crunches  Lie on a firm bed or floor. Keeping your legs in front of you, bend your knees so they are both pointed toward the ceiling and your feet are flat on the floor. Cross your arms over your chest. Slightly tip your chin down without bending your neck. Tense your abdominals and slowly lift your trunk high enough to just clear your shoulder blades. Lifting higher can put excessive stress on the lower back and does not further strengthen your abdominal muscles. Control your return to  the starting position. Repeat 2 times. Complete this exercise 3 times per week.    STRENGTHENING - Quadruped, Opposite UE/LE Lift  Assume a hands and knees position on a firm surface. Keep your hands under your shoulders and your knees under your hips. You may place padding under your knees for comfort. Find your neutral spine and gently tense your abdominal muscles so that you can maintain this position. Your shoulders and hips should form a rectangle that is parallel with the floor and is not twisted. Keeping your trunk steady, lift your right hand no higher than your shoulder and then your left leg no higher than your hip. Make sure you are not holding your breath. Hold this position for 30 seconds. Continuing to keep your abdominal muscles tense and your back steady, slowly return to your starting position. Repeat with the opposite arm and leg. Repeat 2 times. Complete this exercise 3 times per week.   STRENGTHENING - Abdominals and Quadriceps, Straight Leg Raise  Lie on a firm bed or floor with both legs extended in front of you. Keeping one leg in contact with the floor, bend the other knee so that your foot can rest flat on the floor. Find your neutral spine, and tense your abdominal muscles to maintain your spinal position throughout the exercise. Slowly lift your straight leg off the floor about 6 inches for a count of 3, making sure to not hold your breath. Still keeping your neutral spine, slowly lower your leg all the way to the floor. Repeat this exercise with each leg 2 times. Complete this exercise 3 times per week.  POSTURE AND BODY MECHANICS CONSIDERATIONS - Low Back Sprain Keeping correct posture when sitting, standing or completing your activities will reduce the stress put on different body tissues, allowing injured tissues a chance to heal and limiting painful experiences. The following are general guidelines for improved posture.  While reading these guidelines, remember: The exercises prescribed by your provider will help you have the flexibility and  strength to maintain correct postures. The correct posture provides the best environment for your joints to work. All of your joints have less wear and tear when properly supported by a spine with good posture. This means you will experience a healthier, less painful body. Correct posture must be practiced with all of your activities, especially prolonged sitting and standing. Correct posture is as important when doing repetitive low-stress activities (typing) as it is when doing a single heavy-load activity (lifting).  RESTING POSITIONS Consider which positions are most painful for you when choosing a resting position. If you have pain with flexion-based activities (sitting, bending, stooping, squatting), choose a position that allows you to rest in a less flexed posture. You would want to avoid curling into a fetal position on your side. If your pain worsens with extension-based activities (prolonged standing, working overhead), avoid resting in an extended position such as sleeping on your stomach. Most people will find more comfort when they rest with their spine in a more neutral position, neither too rounded nor too arched. Lying on a non-sagging bed on your side with a pillow between your knees, or on your back with a pillow under your knees will often provide some relief. Keep in mind, being in any one position for a prolonged period of time, no matter how correct your posture, can still lead to stiffness.  PROPER SITTING POSTURE In order to minimize stress and discomfort on your spine, you must sit with correct posture.  Sitting with good posture should be effortless for a healthy body. Returning to good posture is a gradual process. Many people can work toward this most comfortably by using various supports until they have the flexibility and strength to maintain this posture on their own. When sitting with proper posture, your ears will fall over your shoulders and your shoulders will fall over  your hips. You should use the back of the chair to support your upper back. Your lower back will be in a neutral position, just slightly arched. You may place a small pillow or folded towel at the base of your lower back for  support.  When working at a desk, create an environment that supports good, upright posture. Without extra support, muscles tire, which leads to excessive strain on joints and other tissues. Keep these recommendations in mind:  CHAIR: A chair should be able to slide under your desk when your back makes contact with the back of the chair. This allows you to work closely. The chair's height should allow your eyes to be level with the upper part of your monitor and your hands to be slightly lower than your elbows.  BODY POSITION Your feet should make contact with the floor. If this is not possible, use a foot rest. Keep your ears over your shoulders. This will reduce stress on your neck and low back.  INCORRECT SITTING POSTURES  If you are feeling tired and unable to assume a healthy sitting posture, do not slouch or slump. This puts excessive strain on your back tissues, causing more damage and pain. Healthier options include: Using more support, like a lumbar pillow. Switching tasks to something that requires you to be upright or walking. Talking a brief walk. Lying down to rest in a neutral-spine position.  PROLONGED STANDING WHILE SLIGHTLY LEANING FORWARD  When completing a task that requires you to lean forward while standing in one place for a long time, place either foot up on a stationary 2-4 inch high object to help maintain the best posture. When both feet are on the ground, the lower back tends to lose its slight inward curve. If this curve flattens (or becomes too large), then the back and your other joints will experience too much stress, tire more quickly, and can cause pain.  CORRECT STANDING POSTURES Proper standing posture should be assumed with all daily  activities, even if they only take a few moments, like when brushing your teeth. As in sitting, your ears should fall over your shoulders and your shoulders should fall over your hips. You should keep a slight tension in your abdominal muscles to brace your spine. Your tailbone should point down to the ground, not behind your body, resulting in an over-extended swayback posture.   INCORRECT STANDING POSTURES  Common incorrect standing postures include a forward head, locked knees and/or an excessive swayback. WALKING Walk with an upright posture. Your ears, shoulders and hips should all line-up.  PROLONGED ACTIVITY IN A FLEXED POSITION When completing a task that requires you to bend forward at your waist or lean over a low surface, try to find a way to stabilize 3 out of 4 of your limbs. You can place a hand or elbow on your thigh or rest a knee on the surface you are reaching across. This will provide you more stability, so that your muscles do not tire as quickly. By keeping your knees relaxed, or slightly bent, you will also reduce stress across your lower back. CORRECT  LIFTING TECHNIQUES  DO : Assume a wide stance. This will provide you more stability and the opportunity to get as close as possible to the object which you are lifting. Tense your abdominals to brace your spine. Bend at the knees and hips. Keeping your back locked in a neutral-spine position, lift using your leg muscles. Lift with your legs, keeping your back straight. Test the weight of unknown objects before attempting to lift them. Try to keep your elbows locked down at your sides in order get the best strength from your shoulders when carrying an object.   Always ask for help when lifting heavy or awkward objects. INCORRECT LIFTING TECHNIQUES DO NOT:  Lock your knees when lifting, even if it is a small object. Bend and twist. Pivot at your feet or move your feet when needing to change directions. Assume that you can  safely pick up even a paperclip without proper posture.

## 2022-11-27 NOTE — Progress Notes (Signed)
Musculoskeletal Exam  Patient: Tiffany Peterson DOB: [birth year]  DOS: 11/27/2022  SUBJECTIVE:  Chief Complaint:   Chief Complaint  Patient presents with   Shoulder Pain    Left    Hip Pain    Right    Breast Problem    Knots Pain in stomach    Tiffany Peterson is a 52 y.o.  female for evaluation and treatment of L shoulder, R hip, back pain.   Onset:  5 months ago. 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   Neurovascular symptoms: no  R hip pain- Started 5 months ago also when she send attributed job lifting more.  No trauma to the area.  No catching/locking, bowel/bladder incontinence, bruising, swelling, or redness.  She localizes it more to her lower back region rather than her hip.  No neurologic signs or symptoms.  Numbness in hands worse when laying down. Was dropping things but this resolved. Has been going on for 4 mo.   Past Medical History:  Diagnosis Date   Anemia    Essential hypertension 08/19/2016   Morbid obesity (HCC)    Vaginal Pap smear, abnormal    colposcopy    Objective: VITAL SIGNS: BP (!) 148/92 (BP Location: Left Arm, Cuff Size: Large)   Pulse 60   Temp 98 F (36.7 C) (Oral)   Ht 5\' 5"  (1.651 m)   Wt 267 lb 2 oz (121.2 kg)   SpO2 97%   BMI 44.45 kg/m  Constitutional: Well formed, well developed. No acute distress. Thorax & Lungs: No accessory muscle use Musculoskeletal: Left shoulder.   Normal active range of motion: yes.   Normal passive range of motion: yes Tenderness to palpation: Yes over the lateral deltoid and left trapezius Deformity: no Ecchymosis: no Mild TTP over lumbar paraspinal musculature.  No midline tenderness. Tests positive: none Tests negative: Spurling's, Tinel's,  Phalen's Neurologic: Normal sensory function. No focal deficits noted. DTR's equal and symmetric in LE's. No clonus.  Grip strength adequate bilaterally. Psychiatric: Normal mood. Age appropriate judgment and insight. Alert & oriented x 3.    Assessment:  Encounter for screening mammogram for malignant neoplasm of breast - Plan: MM DIGITAL SCREENING BILATERAL  Essential hypertension - Plan: lisinopril (ZESTRIL) 20 MG tablet, amLODipine (NORVASC) 5 MG tablet  Hand numbness  Chronic bilateral low back pain without sciatica - Plan: meloxicam (MOBIC) 15 MG tablet, tiZANidine (ZANAFLEX) 4 MG tablet  Chronic left shoulder pain - Plan: meloxicam (MOBIC) 15 MG tablet, tiZANidine (ZANAFLEX) 4 MG tablet  Plan: Needs a mammogram.  Order placed today. Chronic, uncontrolled.  Has not been taking medications at home.  Will reorder lisinopril 20 mg daily, amlodipine 5 mg daily.  Monitor blood pressures at home.  Follow-up in 1 month to recheck. Considering carpal tunnel syndrome.  Mild as she has no positive provocative tests today.  Wrist brace is provided for home.  Would consider injection versus referral if no improvement in the next month.   Stretches/exercises, heat, ice, Tylenol.  Stretches and exercises, heat, ice, Tylenol, meloxicam. The patient voiced understanding and agreement to the plan.   Tiffany Roche [place], DO 11/27/22  4:15 PM

## 2022-12-03 ENCOUNTER — Encounter (HOSPITAL_BASED_OUTPATIENT_CLINIC_OR_DEPARTMENT_OTHER): Payer: Self-pay

## 2022-12-03 ENCOUNTER — Ambulatory Visit (HOSPITAL_BASED_OUTPATIENT_CLINIC_OR_DEPARTMENT_OTHER)
Admission: RE | Admit: 2022-12-03 | Discharge: 2022-12-03 | Disposition: A | Discharge status: Home / Self Care | Payer: BC Managed Care – PPO | Source: Ambulatory Visit | Attending: Family Medicine | Admitting: Family Medicine

## 2022-12-03 DIAGNOSIS — Z1231 Encounter for screening mammogram for malignant neoplasm of breast: Secondary | ICD-10-CM | POA: Diagnosis not present

## 2022-12-24 ENCOUNTER — Other Ambulatory Visit: Payer: Self-pay | Admitting: Family Medicine

## 2022-12-24 DIAGNOSIS — G8929 Other chronic pain: Secondary | ICD-10-CM

## 2022-12-28 ENCOUNTER — Encounter: Payer: Self-pay | Admitting: Family Medicine

## 2022-12-28 ENCOUNTER — Ambulatory Visit: Payer: BC Managed Care – PPO | Admitting: Family Medicine

## 2022-12-28 VITALS — BP 130/82 | HR 63 | Temp 98.0°F | Ht 65.0 in | Wt 268.0 lb

## 2022-12-28 DIAGNOSIS — G8929 Other chronic pain: Secondary | ICD-10-CM | POA: Diagnosis not present

## 2022-12-28 DIAGNOSIS — M25512 Pain in left shoulder: Secondary | ICD-10-CM

## 2022-12-28 DIAGNOSIS — I10 Essential (primary) hypertension: Secondary | ICD-10-CM

## 2022-12-28 LAB — BASIC METABOLIC PANEL
BUN: 17 mg/dL (ref 6–23)
CO2: 28 mEq/L (ref 19–32)
Calcium: 9.5 mg/dL (ref 8.4–10.5)
Chloride: 101 mEq/L (ref 96–112)
Creatinine, Ser: 0.97 mg/dL (ref 0.40–1.20)
GFR: 66.31 mL/min (ref >60.00)
Glucose, Bld: 97 mg/dL (ref 70–99)
Potassium: 4.2 mEq/L (ref 3.5–5.1)
Sodium: 138 mEq/L (ref 135–145)

## 2022-12-28 MED ORDER — ESOMEPRAZOLE MAGNESIUM 20 MG PO CPDR: 20.0000 mg | DELAYED_RELEASE_CAPSULE | Freq: Every day | ORAL | 1 refills | Status: AC | PRN

## 2022-12-28 MED ORDER — SPIRONOLACTONE 25 MG PO TABS: 25.0000 mg | ORAL_TABLET | Freq: Every day | ORAL | 1 refills | Status: DC

## 2022-12-28 NOTE — Patient Instructions (Addendum)
Keep the diet clean and stay active.  Because your blood pressure is well-controlled, you no longer have to check your blood pressure at home anymore unless you wish. Some people check it twice daily every day and some people stop altogether. Either or anything in between is fine. Strong work!  If you do not hear anything about your referral in the next 1-2 weeks, call our office and ask for an update.  Please do the stretches/exercises until your appt.  Let us know if you need anything.

## 2022-12-28 NOTE — Progress Notes (Signed)
Chief Complaint  Patient presents with   Follow-up    Subjective Tiffany Peterson is a 52 y.o. female who presents for hypertension follow up. She does monitor home blood pressures. Does not remember readings at home.  She is compliant with medications- lisinopril 20 mg/d, Norvasc 5 mg/d. Patient has these side effects of medication: none She is sometimes adhering to a healthy diet overall. Current exercise: walking, strength training intermittently No CP or SOB.   Patient has a history of chronic shoulder pain, now going on for approximately 6 months.  She was given stretches and exercises at her last visit but was not fully compliant with them.  She is interested in seeing a specialist for this issue.  No new injury or change in activity.  It is affecting her sleep.   Past Medical History:  Diagnosis Date   Anemia    Essential hypertension 08/19/2016   Morbid obesity (HCC)    Vaginal Pap smear, abnormal    colposcopy    Exam BP 130/82 (BP Location: Left Arm, Cuff Size: Large)   Pulse 63   Temp 98 F (36.7 C) (Oral)   Ht 5\' 5"  (1.651 m)   Wt 268 lb (121.6 kg)   SpO2 97%   BMI 44.60 kg/m  General:  well developed, well nourished, in no apparent distress Heart: RRR, no bruits, no LE edema Lungs: clear to auscultation, no accessory muscle use Psych: well oriented with normal range of affect and appropriate judgment/insight  Essential hypertension - Plan: Basic metabolic panel  Chronic left shoulder pain - Plan: Ambulatory referral to Sports Medicine  Chronic, stable. Cont Norvasc 5 mg/d, lisinopril 20 mg/d, spironolactone 25 mg/d- ck BMP. Counseled on diet and exercise. Chronic, unstable. Cont Mobic 15 mg/d prn. Refer sports med.  F/u in 3 mo for CPE or prn. The patient voiced understanding and agreement to the plan.  Jilda Roche [place], DO 12/28/22  11:00 AM

## 2023-01-13 ENCOUNTER — Encounter: Payer: Self-pay | Admitting: Sports Medicine

## 2023-01-13 ENCOUNTER — Ambulatory Visit: Payer: BC Managed Care – PPO | Admitting: Sports Medicine

## 2023-01-13 VITALS — BP 140/90 | HR 61 | Ht 65.0 in | Wt 264.0 lb

## 2023-01-13 DIAGNOSIS — M25512 Pain in left shoulder: Secondary | ICD-10-CM | POA: Diagnosis not present

## 2023-01-13 DIAGNOSIS — G8929 Other chronic pain: Secondary | ICD-10-CM

## 2023-01-13 MED ORDER — DICLOFENAC SODIUM 75 MG PO TBEC: DELAYED_RELEASE_TABLET | ORAL | 0 refills | Status: DC

## 2023-01-13 NOTE — Progress Notes (Signed)
PCP: Sharlene Dory, DO  Subjective:   HPI: Patient is a 52 y.o. female here for chronic left shoulder pain.  Pain has been present for about 7 months.  No inciting injury that she remembers, but she does do frequent heavy lifting and physical labor at work.  She reports pain primarily around her deltoid but also reports shooting pain and numbness into her left arm.  She experiences relief when she lifts her arm above her head.  She often has to sleep with her arm elevated above her head. However, it is difficult for her to raise her arm to about 90 degrees.  She received some meloxicam and tizanidine from her PCP which provide minimal relief. She takes them roughly every other day. She also received home exercises but has not tried them much.    Past Medical History:  Diagnosis Date   Anemia    Essential hypertension 08/19/2016   Morbid obesity (HCC)    Vaginal Pap smear, abnormal    colposcopy    Current Outpatient Medications on File Prior to Visit  Medication Sig Dispense Refill   amLODipine (NORVASC) 5 MG tablet Take 1 tablet (5 mg total) by mouth daily. 90 tablet 1   APPLE CIDER VINEGAR PO Take 3 capsules by mouth in the morning and at bedtime.     esomeprazole (NEXIUM) 20 MG capsule Take 1 capsule (20 mg total) by mouth daily as needed (acid reflux). 90 capsule 1   lisinopril (ZESTRIL) 20 MG tablet Take 1 tablet (20 mg total) by mouth daily. 90 tablet 1   spironolactone (ALDACTONE) 25 MG tablet Take 1 tablet (25 mg total) by mouth daily. 90 tablet 1   tiZANidine (ZANAFLEX) 4 MG tablet Take 1 tablet (4 mg total) by mouth every 6 (six) hours as needed for muscle spasms. 30 tablet 0   vitamin B-12 (CYANOCOBALAMIN) 1000 MCG tablet Take 1,000 mcg by mouth daily.     No current facility-administered medications on file prior to visit.    Past Surgical History:  Procedure Laterality Date   DILITATION & CURRETTAGE/HYSTROSCOPY WITH NOVASURE ABLATION N/A 08/13/2020   Procedure:  DILATATION & CURETTAGE/HYSTEROSCOPY WITH NOVASURE ABLATION, paracervical block;  Surgeon: Willodean Rosenthal, MD;  Location: [facility] OR;  Service: Gynecology;  Laterality: N/A;   IUD REMOVAL  08/13/2020   Procedure: INTRAUTERINE DEVICE (IUD) REMOVAL;  Surgeon: Willodean Rosenthal, MD;  Location: [facility] OR;  Service: Gynecology;;   KNEE ARTHROSCOPY WITH EXCISION BAKER'S CYST     TUBAL LIGATION      Allergies  Allergen Reactions   Lipitor [Atorvastatin] Other (See Comments)    Throat swelling/sinus issues    BP (!) 140/90 (BP Location: Left Arm, Patient Position: Sitting)   Pulse 61   Ht 5\' 5"  (1.651 m)   Wt 264 lb (119.7 kg)   SpO2 96%   BMI 43.93 kg/m       No data to display              No data to display              Objective:  Physical Exam:  Gen: NAD, comfortable in exam room  L shoulder: Inspection: No gross deformity, ecchymosis, swelling Palpation: Mild tenderness over the deltoid, otherwise nontender along clavicle, AC joint, scapula ROM: Pain with shoulder flexion and abduction to 90-100 degrees, though patient is able to raise her shoulder though the pain.  No pain/stiffness with external rotation Strength: 5/5 Special tests: Positive empty can, positive Juanetta Gosling,  positive Neer's   Assessment & Plan:  1.  Left shoulder pain 2/2 likely rotator cuff syndrome: Physical exam findings consistent with possible impingement.  Less likely to be cervical radiculopathy.  Trial diclofenac twice daily for 5 days then as needed.  Referral to physical therapy.  Follow-up in 4 to 6 weeks. If no improvement, consider imaging +/- corticosteroid injection  Patient seen and evaluated with the resident.  I agree with the above plan of care.  Treatment as above with diclofenac and physical therapy.  We did discuss the option of a single physical therapy visit for home exercise education if multiple visits are prohibitive for her.  Follow-up in 4 to 6 weeks.  If no improvement,  consider subacromial cortisone injection and imaging.

## 2023-01-21 ENCOUNTER — Ambulatory Visit: Payer: BC Managed Care – PPO | Attending: Sports Medicine | Admitting: Physical Therapy

## 2023-01-21 ENCOUNTER — Encounter: Payer: Self-pay | Admitting: Physical Therapy

## 2023-01-21 DIAGNOSIS — R252 Cramp and spasm: Secondary | ICD-10-CM

## 2023-01-21 DIAGNOSIS — R293 Abnormal posture: Secondary | ICD-10-CM | POA: Diagnosis not present

## 2023-01-21 DIAGNOSIS — M25532 Pain in left wrist: Secondary | ICD-10-CM | POA: Diagnosis not present

## 2023-01-21 DIAGNOSIS — G8929 Other chronic pain: Secondary | ICD-10-CM | POA: Diagnosis not present

## 2023-01-21 DIAGNOSIS — M25512 Pain in left shoulder: Secondary | ICD-10-CM | POA: Diagnosis not present

## 2023-01-21 NOTE — Therapy (Signed)
OUTPATIENT PHYSICAL THERAPY SHOULDER EVALUATION   Patient Name: Tiffany Peterson MRN: [id number] DOB:09-14-1970, 52 y.o., female Today's Date: 01/21/2023  END OF SESSION:  PT End of Session - 01/21/23 0812     Visit Number 1    Date for PT Re-Evaluation 03/04/23    Authorization Type [employer]    PT Start Time 0806    PT Stop Time 0850    PT Time Calculation (min) 44 min             Past Medical History:  Diagnosis Date   Anemia    Essential hypertension 08/19/2016   Morbid obesity (HCC)    Vaginal Pap smear, abnormal    colposcopy   Past Surgical History:  Procedure Laterality Date   DILITATION & CURRETTAGE/HYSTROSCOPY WITH NOVASURE ABLATION N/A 08/13/2020   Procedure: DILATATION & CURETTAGE/HYSTEROSCOPY WITH NOVASURE ABLATION, paracervical block;  Surgeon: Willodean Rosenthal, MD;  Location: [facility] OR;  Service: Gynecology;  Laterality: N/A;   IUD REMOVAL  08/13/2020   Procedure: INTRAUTERINE DEVICE (IUD) REMOVAL;  Surgeon: Willodean Rosenthal, MD;  Location: [facility] OR;  Service: Gynecology;;   KNEE ARTHROSCOPY WITH EXCISION BAKER'S CYST     TUBAL LIGATION     Patient Active Problem List   Diagnosis Date Noted   Gastroesophageal reflux disease 03/31/2021   Iron deficiency anemia due to chronic blood loss 12/07/2018   Menorrhagia 12/07/2018   Meralgia paraesthetica, right 12/01/2018   Chest pain in adult 10/12/2018   Anemia 10/11/2018   Oropharyngeal dysphagia 10/03/2018   Morbid obesity (HCC)    Hypersomnia 09/14/2016   Sinus congestion 09/14/2016   Insomnia 09/14/2016   Essential hypertension 08/19/2016   Mild dysplasia of cervix (CIN I) 12/11/2013   Right ovarian cyst 11/13/2013    PCP: Sharlene Dory, DO   REFERRING PROVIDER: Ralene Cork, DO   REFERRING DIAG: [phone] (ICD-10-CM) - Chronic left shoulder pain   THERAPY DIAG:  Chronic left shoulder pain  Cramp and spasm  Abnormal posture  Pain in left wrist  Rationale for Evaluation  and Treatment: Rehabilitation  ONSET DATE: January 2024  SUBJECTIVE:                                                                                                                                                                                      SUBJECTIVE STATEMENT: Pt. Reported she started a new job when the pain started, a lot of pulling and lifting, now she works with a man that does a lot of the physical part and takes the load off, she went to the sports doc and whatever medicine he gave her worked, she couldn't hardly lift her shoulder, now  she can move her shoulder all around, just has a little pull in her upper arm and tingling in fingers, now if pain comes back just takes a ibuprofen and the pain goes away. The medicine she's taking daily is diclofenac, can't wait to get home and take it.  Takes ibuprofen if isn't time to take her diclofenac and starts to feel pain.  She also gets tingling in both hands now, that bothers her.  Has been giving hand splints for wrists, but can't wear them at work.  Tingling tends to wake at night and starting to drop things.     Hand dominance: Right  PERTINENT HISTORY: HTN, GERD, obesity, chronic L shoulder pain, R knee pain, anemia  PAIN:  Are you having pain? Yes: NPRS scale: 2-7/10 Pain location: L arm Pain description: throbbing, also gets tingling in both hands Aggravating factors: lifting, pulling Relieving factors: diclofenac, ibuprofen  PRECAUTIONS: None  RED FLAGS: None   WEIGHT BEARING RESTRICTIONS: No  FALLS:  Has patient fallen in last 6 months? No  OCCUPATION: [employer]  PLOF: Independent  PATIENT GOALS: get rid of pain  NEXT MD VISIT: as needed.   OBJECTIVE:   DIAGNOSTIC FINDINGS:  No imaging  PATIENT SURVEYS:  Quick Dash 47.7%  COGNITION: Overall cognitive status: Within functional limits for tasks assessed     SENSATION: WFL  POSTURE: Forward head, rounded shoulders  UPPER EXTREMITY ROM:    Active ROM Right eval Left eval  Shoulder flexion 140 155p!  Shoulder extension    Shoulder abduction 150 150 lowering arm hurts  Shoulder adduction    Shoulder internal rotation  Pain! Fastening bra in front  Shoulder external rotation T2 T2 pain, doing hair on R  (Blank rows = not tested)  UPPER EXTREMITY MMT:  MMT Right eval Left eval  Shoulder flexion 5 5p!  Shoulder extension    Shoulder abduction 5 5p!  Shoulder adduction    Shoulder internal rotation 5 5  Shoulder external rotation 5 5  Middle trapezius    Lower trapezius    Elbow flexion 5 5  Elbow extension 5 5  Wrist flexion 4+ 4+  Wrist extension 4+ 4+  Grip strength (lbs) 30 15  (Blank rows = not tested)  SHOULDER SPECIAL TESTS: Impingement tests: Painful arc test: positive   PALPATION:  Tenderness L anterior shoulder at RTC attachment, biceps tendon, posterior shoulder at supraspinatus.  Tenderness and palpable trigger points L UT and levator scapulae.     TODAY'S TREATMENT:                                                                                                                                         DATE:   01/21/2023 Therapeutic Exercise: to improve strength and mobility.  Demo, verbal and tactile cues throughout for technique. Scap squeezes Levator stretch Upper trap stretch SCM stretch Tendon glides for wrist -  demo only - do not pull on thumb if painful, wrist motions only.  Self Care: Education on medication use, side effects, wrist splints, findings, plan of care, posture, anatomy, and recommendations.    PATIENT EDUCATION: Education details: see self care Person educated: Patient Education method: Explanation, Demonstration, Verbal cues, and Handouts Education comprehension: verbalized understanding and returned demonstration  HOME EXERCISE PROGRAM: Access Code: [email or web address] URL: [email or web address] Date: 01/21/2023 Prepared by: Harrie Foreman  Exercises -  Seated Scapular Retraction  - 2 x daily - 7 x weekly - 1 sets - 10 reps - 5 hold - Seated Levator Scapulae Stretch  - 2 x daily - 7 x weekly - 3 reps - 30 hold - Seated Upper Trapezius Stretch  - 2 x daily - 7 x weekly - 3 reps - 30 hold - Sternocleidomastoid Stretch  - 2 x daily - 7 x weekly - 3 reps - 30 hold  [email or web address]://[email or web address].[email or web address]/pdfs/[email or web address]_therapeutic-[email or web address]-program-[email or web address]-[email or web address]_final.[email or web address]  ASSESSMENT:  CLINICAL IMPRESSION: Patient is a 52 y.o. Right hand dominant who was seen today for physical therapy evaluation and treatment for chronic L shoulder pain.   She reports pain is currently well controlled by medication, however she is having to take diclofenac 2x/daily plus ibuprofen for break through pain.  Concerned that she is taking same classification (NSAID so close together) recommended discussing with MD or pharmacist or alternating with Tylenol instead, but making sure to take only recommended dosage. She is being careful to take both NSAIDs with food or smoothie.  She is also reporting hand/wrist pain and tingling in both hands, worse in mornings, has been given wrist splints and tentative diagnosis of Carpal tunnels syndrome.  Ardell Isaacs demonstrates good ROM and strength but pain with reaching, dressing, performing ADLs, tenderness over biceps tendon and RTC, all consistent with impingement syndrome.  Her work positioning and long shifts (10 hours) and posture are contributing to both shoulder and wrist pain.  Discussed need for posterior shoulder and postural strengthening to improve shoulder shoulder pain, also provided hand out for CTS for wrists, provided recommendations on wrist splint use at night. She would prefer focus on HEP and limited follow-ups due to copays.  Provided initial HEP today.     OBJECTIVE IMPAIRMENTS: decreased activity tolerance, decreased knowledge of condition, decreased knowledge of use of DME, decreased ROM, decreased strength,  increased fascial restrictions, impaired perceived functional ability, increased muscle spasms, impaired UE functional use, postural dysfunction, and pain.   ACTIVITY LIMITATIONS: carrying, lifting, bending, bathing, dressing, reach over head, and hygiene/grooming  PARTICIPATION LIMITATIONS: meal prep, cleaning, laundry, shopping, and occupation  PERSONAL FACTORS: Fitness, Time since onset of injury/illness/exacerbation, and 1-2 comorbidities: HTN, GERD, obesity, chronic L shoulder pain, R knee pain, anemia  are also affecting patient's functional outcome.   REHAB POTENTIAL: Good  CLINICAL DECISION MAKING: Evolving/moderate complexity  EVALUATION COMPLEXITY: Moderate   GOALS: Goals reviewed with patient? Yes  SHORT TERM GOALS: Target date: 02/04/2023   Patient will be independent with initial HEP.  Baseline:  Goal status: INITIAL   LONG TERM GOALS: Target date: 03/04/2023  Patient will be independent with advanced/ongoing HEP to improve outcomes and carryover.  Baseline:  Goal status: INITIAL  2.  Patient will report 75% improvement in left shoulder pain to improve QOL.  Baseline:  Goal status: INITIAL  3.  Patient to improve L shoulder AROM to WNL without pain provocation to allow for increased ease of ADLs.  Baseline: pain with lowering arm, reaching behind back, doing hair, donning/doffing jacket, etc. Goal  status: INITIAL  4.   Patient will report 10 points on QuickDash to demonstrate improved functional ability.  Baseline: 47.7% impairment Goal status: INITIAL  5.  Patient will report being able to decrease use of daily medication to manage L shoulder pain to decrease risk of side effects   Baseline: taking diclofenac 2x/day + ibuprofen for break through pain  Goal status: INITIAL   PLAN:  PT FREQUENCY: 1-2x/week patient prefers 1x/week due to copay  PT DURATION: 6 weeks  PLANNED INTERVENTIONS: Therapeutic exercises, Therapeutic activity, Neuromuscular  re-education, Balance training, Gait training, Patient/Family education, Self Care, Joint mobilization, Dry Needling, Electrical stimulation, Spinal manipulation, Spinal mobilization, Cryotherapy, Moist heat, Taping, Ultrasound, Ionotophoresis 4mg /ml Dexamethasone, Manual therapy, and Re-evaluation  PLAN FOR NEXT SESSION: progress HEP for postural strengthening, trio for shoulder impingement, ionto for shoulder pain, TrDN for UT?   Jena Gauss, PT, DPT  01/21/2023, 10:01 AM

## 2023-01-27 ENCOUNTER — Other Ambulatory Visit: Payer: Self-pay | Admitting: Family Medicine

## 2023-01-27 ENCOUNTER — Ambulatory Visit: Payer: BC Managed Care – PPO

## 2023-01-27 DIAGNOSIS — R293 Abnormal posture: Secondary | ICD-10-CM

## 2023-01-27 DIAGNOSIS — G8929 Other chronic pain: Secondary | ICD-10-CM

## 2023-01-27 DIAGNOSIS — M545 Low back pain, unspecified: Secondary | ICD-10-CM

## 2023-01-27 DIAGNOSIS — M25532 Pain in left wrist: Secondary | ICD-10-CM

## 2023-01-27 DIAGNOSIS — R252 Cramp and spasm: Secondary | ICD-10-CM | POA: Diagnosis not present

## 2023-01-27 DIAGNOSIS — M25512 Pain in left shoulder: Secondary | ICD-10-CM | POA: Diagnosis not present

## 2023-01-27 NOTE — Therapy (Signed)
OUTPATIENT PHYSICAL THERAPY SHOULDER TREATMENT   Patient Name: Tiffany Peterson MRN: [id number] DOB:[birth year], 52 y.o., female Today's Date: 01/27/2023  END OF SESSION:  PT End of Session - 01/27/23 0816     Visit Number 2    Date for PT Re-Evaluation 03/04/23    Authorization Type BCBS    PT Start Time 0803    PT Stop Time 0848    PT Time Calculation (min) 45 min    Activity Tolerance Patient tolerated treatment well    Behavior During Therapy [facility] for tasks assessed/performed              Past Medical History:  Diagnosis Date   Anemia    Essential hypertension 08/19/2016   Morbid obesity (HCC)    Vaginal Pap smear, abnormal    colposcopy   Past Surgical History:  Procedure Laterality Date   DILITATION & CURRETTAGE/HYSTROSCOPY WITH NOVASURE ABLATION N/A 08/13/2020   Procedure: DILATATION & CURETTAGE/HYSTEROSCOPY WITH NOVASURE ABLATION, paracervical block;  Surgeon: Willodean Rosenthal, MD;  Location: [facility] OR;  Service: Gynecology;  Laterality: N/A;   IUD REMOVAL  08/13/2020   Procedure: INTRAUTERINE DEVICE (IUD) REMOVAL;  Surgeon: Willodean Rosenthal, MD;  Location: [facility] OR;  Service: Gynecology;;   KNEE ARTHROSCOPY WITH EXCISION BAKER'S CYST     TUBAL LIGATION     Patient Active Problem List   Diagnosis Date Noted   Gastroesophageal reflux disease 03/31/2021   Iron deficiency anemia due to chronic blood loss 12/07/2018   Menorrhagia 12/07/2018   Meralgia paraesthetica, right 12/01/2018   Chest pain in adult 10/12/2018   Anemia 10/11/2018   Oropharyngeal dysphagia 10/03/2018   Morbid obesity (HCC)    Hypersomnia 09/14/2016   Sinus congestion 09/14/2016   Insomnia 09/14/2016   Essential hypertension 08/19/2016   Mild dysplasia of cervix (CIN I) 12/11/2013   Right ovarian cyst 11/13/2013    PCP: Sharlene Dory, DO   REFERRING PROVIDER: Ralene Cork, DO   REFERRING DIAG: [phone] (ICD-10-CM) - Chronic left shoulder pain   THERAPY DIAG:   Chronic left shoulder pain  Cramp and spasm  Abnormal posture  Pain in left wrist  Rationale for Evaluation and Treatment: Rehabilitation  ONSET DATE: January 2024  SUBJECTIVE:                                                                                                                                                                                      SUBJECTIVE STATEMENT: Pt reports the Diclofenac has been really helping with the pain.    Hand dominance: Right  PERTINENT HISTORY: HTN, GERD, obesity, chronic L shoulder pain, R knee pain, anemia  PAIN:  Are you  having pain? Yes: NPRS scale: 3/10 Pain location: L arm Pain description: throbbing, also gets tingling in both hands Aggravating factors: lifting, pulling Relieving factors: diclofenac, ibuprofen  PRECAUTIONS: None  RED FLAGS: None   WEIGHT BEARING RESTRICTIONS: No  FALLS:  Has patient fallen in last 6 months? No  OCCUPATION: [employer]  PLOF: Independent  PATIENT GOALS: get rid of pain  NEXT MD VISIT: as needed.   OBJECTIVE:   DIAGNOSTIC FINDINGS:  No imaging  PATIENT SURVEYS:  Quick Dash 47.7%  COGNITION: Overall cognitive status: Within functional limits for tasks assessed     SENSATION: WFL  POSTURE: Forward head, rounded shoulders  UPPER EXTREMITY ROM:   Active ROM Right eval Left eval  Shoulder flexion 140 155p!  Shoulder extension    Shoulder abduction 150 150 lowering arm hurts  Shoulder adduction    Shoulder internal rotation  Pain! Fastening bra in front  Shoulder external rotation T2 T2 pain, doing hair on R  (Blank rows = not tested)  UPPER EXTREMITY MMT:  MMT Right eval Left eval  Shoulder flexion 5 5p!  Shoulder extension    Shoulder abduction 5 5p!  Shoulder adduction    Shoulder internal rotation 5 5  Shoulder external rotation 5 5  Middle trapezius    Lower trapezius    Elbow flexion 5 5  Elbow extension 5 5  Wrist flexion 4+ 4+  Wrist  extension 4+ 4+  Grip strength (lbs) 30 15  (Blank rows = not tested)  SHOULDER SPECIAL TESTS: Impingement tests: Painful arc test: positive   PALPATION:  Tenderness L anterior shoulder at RTC attachment, biceps tendon, posterior shoulder at supraspinatus.  Tenderness and palpable trigger points L UT and levator scapulae.     TODAY'S TREATMENT:                                                                                                                                         DATE:  01/27/2023 Therapeutic Exercise: to improve strength and mobility.  Demo, verbal and tactile cues throughout for technique. UBE L1.0 3 min fwd and 3 min back Standing rows RTB x 10 - cues for scap retraction  Standing shld ext RTB x 10  Attempted S/L exercises on L but this position increased pain Supine shoulder flexion AAROM with cane x 10  Supine L shoulder abduction AAROM with cane x 10  Supine L shoulder ER with cane x 10  Wall slides flexion and scaption x 5- pain after 5 reps Reviewed table slides  01/21/2023 Therapeutic Exercise: to improve strength and mobility.  Demo, verbal and tactile cues throughout for technique. Scap squeezes Levator stretch Upper trap stretch SCM stretch Tendon glides for wrist - demo only - do not pull on thumb if painful, wrist motions only.  Self Care: Education on medication use, side effects, wrist splints, findings, plan of care, posture, anatomy, and recommendations.  PATIENT EDUCATION: Education details: see self care Person educated: Patient Education method: Explanation, Demonstration, Verbal cues, and Handouts Education comprehension: verbalized understanding and returned demonstration  HOME EXERCISE PROGRAM: Access Code: [email or web address] URL: [email or web address] Date: 01/21/2023 Prepared by: Harrie Foreman  Exercises - Seated Scapular Retraction  - 2 x daily - 7 x weekly - 1 sets - 10 reps - 5 hold - Seated Levator Scapulae Stretch  - 2  x daily - 7 x weekly - 3 reps - 30 hold - Seated Upper Trapezius Stretch  - 2 x daily - 7 x weekly - 3 reps - 30 hold - Sternocleidomastoid Stretch  - 2 x daily - 7 x weekly - 3 reps - 30 hold  https://orthoinfo.aaos.org/globalassets/pdfs/a00789_therapeutic-exercise-program-for-carpal-tunnel_final.pdf  ASSESSMENT:  CLINICAL IMPRESSION: Progressed through postural and shoulder ROM exercises. She has trouble with comfortably lying on R side so we did not do any of the impingement exercises. Wall slides into scaption felt better than flexion and she was also educated on table slides if too much pain with wall. She is interested in DN for next visit.    OBJECTIVE IMPAIRMENTS: decreased activity tolerance, decreased knowledge of condition, decreased knowledge of use of DME, decreased ROM, decreased strength, increased fascial restrictions, impaired perceived functional ability, increased muscle spasms, impaired UE functional use, postural dysfunction, and pain.   ACTIVITY LIMITATIONS: carrying, lifting, bending, bathing, dressing, reach over head, and hygiene/grooming  PARTICIPATION LIMITATIONS: meal prep, cleaning, laundry, shopping, and occupation  PERSONAL FACTORS: Fitness, Time since onset of injury/illness/exacerbation, and 1-2 comorbidities: HTN, GERD, obesity, chronic L shoulder pain, R knee pain, anemia  are also affecting patient's functional outcome.   REHAB POTENTIAL: Good  CLINICAL DECISION MAKING: Evolving/moderate complexity  EVALUATION COMPLEXITY: Moderate   GOALS: Goals reviewed with patient? Yes  SHORT TERM GOALS: Target date: 02/04/2023   Patient will be independent with initial HEP.  Baseline:  Goal status: INITIAL   LONG TERM GOALS: Target date: 03/04/2023  Patient will be independent with advanced/ongoing HEP to improve outcomes and carryover.  Baseline:  Goal status: INITIAL  2.  Patient will report 75% improvement in left shoulder pain to improve QOL.   Baseline:  Goal status: INITIAL  3.  Patient to improve L shoulder AROM to WNL without pain provocation to allow for increased ease of ADLs.  Baseline: pain with lowering arm, reaching behind back, doing hair, donning/doffing jacket, etc. Goal status: INITIAL  4.   Patient will report 10 points on QuickDash to demonstrate improved functional ability.  Baseline: 47.7% impairment Goal status: INITIAL  5.  Patient will report being able to decrease use of daily medication to manage L shoulder pain to decrease risk of side effects   Baseline: taking diclofenac 2x/day + ibuprofen for break through pain  Goal status: INITIAL   PLAN:  PT FREQUENCY: 1-2x/week patient prefers 1x/week due to copay  PT DURATION: 6 weeks  PLANNED INTERVENTIONS: Therapeutic exercises, Therapeutic activity, Neuromuscular re-education, Balance training, Gait training, Patient/Family education, Self Care, Joint mobilization, Dry Needling, Electrical stimulation, Spinal manipulation, Spinal mobilization, Cryotherapy, Moist heat, Taping, Ultrasound, Ionotophoresis 4mg /ml Dexamethasone, Manual therapy, and Re-evaluation  PLAN FOR NEXT SESSION: patient agreed to DN; progress HEP for postural strengthening, trio for shoulder impingement, ionto for shoulder pain,    Eliany Mccarter L Marquette Blodgett, PTA 01/27/2023, 9:15 AM

## 2023-02-03 ENCOUNTER — Ambulatory Visit: Payer: BC Managed Care – PPO | Admitting: Physical Therapy

## 2023-02-03 DIAGNOSIS — R293 Abnormal posture: Secondary | ICD-10-CM

## 2023-02-03 DIAGNOSIS — G8929 Other chronic pain: Secondary | ICD-10-CM | POA: Diagnosis not present

## 2023-02-03 DIAGNOSIS — M25532 Pain in left wrist: Secondary | ICD-10-CM | POA: Diagnosis not present

## 2023-02-03 DIAGNOSIS — R252 Cramp and spasm: Secondary | ICD-10-CM | POA: Diagnosis not present

## 2023-02-03 DIAGNOSIS — M25512 Pain in left shoulder: Secondary | ICD-10-CM | POA: Diagnosis not present

## 2023-02-03 NOTE — Therapy (Signed)
OUTPATIENT PHYSICAL THERAPY SHOULDER TREATMENT   Patient Name: Tiffany Peterson MRN: [id number] DOB:Dec 30, 1970, 52 y.o., female Today's Date: 02/03/2023  END OF SESSION:  PT End of Session - 02/03/23 0850     Visit Number 3    Date for PT Re-Evaluation 03/04/23    Authorization Type BCBS    PT Start Time 0848    PT Stop Time 0932    PT Time Calculation (min) 44 min    Activity Tolerance Patient tolerated treatment well    Behavior During Therapy [facility] for tasks assessed/performed              Past Medical History:  Diagnosis Date   Anemia    Essential hypertension 08/19/2016   Morbid obesity (HCC)    Vaginal Pap smear, abnormal    colposcopy   Past Surgical History:  Procedure Laterality Date   DILITATION & CURRETTAGE/HYSTROSCOPY WITH NOVASURE ABLATION N/A 08/13/2020   Procedure: DILATATION & CURETTAGE/HYSTEROSCOPY WITH NOVASURE ABLATION, paracervical block;  Surgeon: Willodean Rosenthal, MD;  Location: [facility] OR;  Service: Gynecology;  Laterality: N/A;   IUD REMOVAL  08/13/2020   Procedure: INTRAUTERINE DEVICE (IUD) REMOVAL;  Surgeon: Willodean Rosenthal, MD;  Location: [facility] OR;  Service: Gynecology;;   KNEE ARTHROSCOPY WITH EXCISION BAKER'S CYST     TUBAL LIGATION     Patient Active Problem List   Diagnosis Date Noted   Gastroesophageal reflux disease 03/31/2021   Iron deficiency anemia due to chronic blood loss 12/07/2018   Menorrhagia 12/07/2018   Meralgia paraesthetica, right 12/01/2018   Chest pain in adult 10/12/2018   Anemia 10/11/2018   Oropharyngeal dysphagia 10/03/2018   Morbid obesity (HCC)    Hypersomnia 09/14/2016   Sinus congestion 09/14/2016   Insomnia 09/14/2016   Essential hypertension 08/19/2016   Mild dysplasia of cervix (CIN I) 12/11/2013   Right ovarian cyst 11/13/2013    PCP: Sharlene Dory, DO   REFERRING PROVIDER: Ralene Cork, DO   REFERRING DIAG: [phone] (ICD-10-CM) - Chronic left shoulder pain   THERAPY DIAG:   Chronic left shoulder pain  Cramp and spasm  Abnormal posture  Rationale for Evaluation and Treatment: Rehabilitation  ONSET DATE: January 2024  SUBJECTIVE:                                                                                                                                                                                      SUBJECTIVE STATEMENT: Patient very tired, got off work at 5 and accidentally arrived at 8 for 8:45 appt.   The pills are helping pain.   Didn't get a chance to do HEP this weekend, was busy babysitting grandchildren.  Hand dominance:  Right  PERTINENT HISTORY: HTN, GERD, obesity, chronic L shoulder pain, R knee pain, anemia  PAIN:  Are you having pain? Yes: NPRS scale: 3/10 Pain location: L arm Pain description: throbbing, also gets tingling in both hands Aggravating factors: lifting, pulling Relieving factors: diclofenac, ibuprofen  PRECAUTIONS: None  RED FLAGS: None   WEIGHT BEARING RESTRICTIONS: No  FALLS:  Has patient fallen in last 6 months? No  OCCUPATION: [employer]  PLOF: Independent  PATIENT GOALS: get rid of pain  NEXT MD VISIT: as needed.   OBJECTIVE:   DIAGNOSTIC FINDINGS:  No imaging  PATIENT SURVEYS:  Quick Dash 47.7%  COGNITION: Overall cognitive status: Within functional limits for tasks assessed     SENSATION: WFL  POSTURE: Forward head, rounded shoulders  UPPER EXTREMITY ROM:   Active ROM Right eval Left eval  Shoulder flexion 140 155p!  Shoulder extension    Shoulder abduction 150 150 lowering arm hurts  Shoulder adduction    Shoulder internal rotation  Pain! Fastening bra in front  Shoulder external rotation T2 T2 pain, doing hair on R  (Blank rows = not tested)  UPPER EXTREMITY MMT:  MMT Right eval Left eval  Shoulder flexion 5 5p!  Shoulder extension    Shoulder abduction 5 5p!  Shoulder adduction    Shoulder internal rotation 5 5  Shoulder external rotation 5 5  Middle  trapezius    Lower trapezius    Elbow flexion 5 5  Elbow extension 5 5  Wrist flexion 4+ 4+  Wrist extension 4+ 4+  Grip strength (lbs) 30 15  (Blank rows = not tested)  SHOULDER SPECIAL TESTS: Impingement tests: Painful arc test: positive   PALPATION:  Tenderness L anterior shoulder at RTC attachment, biceps tendon, posterior shoulder at supraspinatus.  Tenderness and palpable trigger points L UT and levator scapulae.     TODAY'S TREATMENT:                                                                                                                                         DATE:   02/03/23 Therapeutic Exercise: to improve strength and mobility.  Demo, verbal and tactile cues throughout for technique. UBE L1 x 6 min (41f/3b) S/L open book stretches x 10 each On pool noodle - thoracic mobs - tolerated fairly Seated shoulder isometric exercises - R - flexion, extension, IR, ER 5 x 5 sec hold each Manual Therapy: to decrease muscle spasm and pain and improve mobility STM/TPR to bil UT, L/S, cervical paraspinals, skilled palpation and monitoring during dry needling. Trigger Point Dry-Needling  Treatment instructions: Expect mild to moderate muscle soreness. S/S of pneumothorax if dry needled over a lung field, and to seek immediate medical attention should they occur. Patient verbalized understanding of these instructions and education. Patient Consent Given: Yes Education handout provided: Yes Muscles treated: L UT, R UT, L supraspinatus Electrical stimulation performed:  No Parameters: N/A Treatment response/outcome: Twitch Response Elicited and Palpable Increase in Muscle Length  01/27/2023 Therapeutic Exercise: to improve strength and mobility.  Demo, verbal and tactile cues throughout for technique. UBE L1.0 3 min fwd and 3 min back Standing rows RTB x 10 - cues for scap retraction  Standing shld ext RTB x 10  Attempted S/L exercises on L but this position increased pain Supine  shoulder flexion AAROM with cane x 10  Supine L shoulder abduction AAROM with cane x 10  Supine L shoulder ER with cane x 10  Wall slides flexion and scaption x 5- pain after 5 reps Reviewed table slides   01/21/2023 Therapeutic Exercise: to improve strength and mobility.  Demo, verbal and tactile cues throughout for technique. Scap squeezes Levator stretch Upper trap stretch SCM stretch Tendon glides for wrist - demo only - do not pull on thumb if painful, wrist motions only.  Self Care: Education on medication use, side effects, wrist splints, findings, plan of care, posture, anatomy, and recommendations.    PATIENT EDUCATION: Education details: HEP review and update, TrDN Person educated: Patient Education method: Explanation, Demonstration, Verbal cues, and Handouts Education comprehension: verbalized understanding and returned demonstration  HOME EXERCISE PROGRAM: Access Code: [email or web address] URL: [email or web address] Date: 02/03/2023 Prepared by: Harrie Foreman  Exercises - Seated Levator Scapulae Stretch  - 2 x daily - 7 x weekly - 3 reps - 30 hold - Seated Upper Trapezius Stretch  - 2 x daily - 7 x weekly - 3 reps - 30 hold - Sternocleidomastoid Stretch  - 2 x daily - 7 x weekly - 3 reps - 30 hold - Standing Bilateral Low Shoulder Row with Anchored Resistance  - 1 x daily - 3 x weekly - 2 sets - 10 reps - Shoulder Extension with Resistance  - 1 x daily - 3 x weekly - 2 sets - 10 reps - Shoulder Flexion Wall Slide with Towel  - 1 x daily - 7 x weekly - 2 sets - 10 reps - Scaption Wall Slide with Towel  - 1 x daily - 7 x weekly - 2 sets - 10 reps - Sidelying Open Book Thoracic Lumbar Rotation and Extension  - 1 x daily - 7 x weekly - 1 sets - 10 reps - Isometric Shoulder External Rotation  - 3 x daily - 7 x weekly - 1 sets - 5 reps - 5 hold - Isometric Shoulder Internal Rotation  - 3 x daily - 7 x weekly - 1 sets - 5 reps - 5 hold - Isometric Shoulder Flexion  -  3 x daily - 7 x weekly - 1 sets - 5 reps - 5 hold  https://orthoinfo.aaos.org/globalassets/pdfs/a00789_therapeutic-exercise-program-for-carpal-tunnel_final.pdf  ASSESSMENT:  CLINICAL IMPRESSION: Tiffany Peterson reports more generalized pain today in both upper shoulders and down L upper arm and in upper back.  She has been inconsistent doing exerices due to work and grandkids, and trying to incorporate exercises into day, so raising arm instead of wall slides at work, so gave her shoulder isometrics that she could do throughout the day without needing any equipment.  After explanation of DN rational, procedures, outcomes and potential side effects, patient verbalized consent to DN treatment in conjunction with manual STM/DTM and TPR to reduce ttp/muscle tension. Muscles treated as indicated above. DN produced normal response with good twitches elicited resulting in palpable reduction in pain/ttp and muscle tension, with patient noting less pain upon initiation of movement following DN. Pt educated to expect mild  to moderate muscle soreness for up to 24-48 hrs and instructed to continue prescribed home exercise program and current activity level with pt verbalizing understanding of theses instructions.  Tiffany Peterson continues to demonstrate potential for improvement and would benefit from continued skilled therapy to address impairments.      OBJECTIVE IMPAIRMENTS: decreased activity tolerance, decreased knowledge of condition, decreased knowledge of use of DME, decreased ROM, decreased strength, increased fascial restrictions, impaired perceived functional ability, increased muscle spasms, impaired UE functional use, postural dysfunction, and pain.   ACTIVITY LIMITATIONS: carrying, lifting, bending, bathing, dressing, reach over head, and hygiene/grooming  PARTICIPATION LIMITATIONS: meal prep, cleaning, laundry, shopping, and occupation  PERSONAL FACTORS: Fitness, Time since onset of  injury/illness/exacerbation, and 1-2 comorbidities: HTN, GERD, obesity, chronic L shoulder pain, R knee pain, anemia  are also affecting patient's functional outcome.   Tiffany POTENTIAL: Good  CLINICAL DECISION MAKING: Evolving/moderate complexity  EVALUATION COMPLEXITY: Moderate   GOALS: Goals reviewed with patient? Yes  SHORT TERM GOALS: Target date: 02/04/2023   Patient will be independent with initial HEP.  Baseline:  Goal status: MET 02/03/23    LONG TERM GOALS: Target date: 03/04/2023  Patient will be independent with advanced/ongoing HEP to improve outcomes and carryover.  Baseline:  Goal status: IN PROGRESS 02/03/23- progressed   2.  Patient will report 75% improvement in left shoulder pain to improve QOL.  Baseline:  Goal status: IN PROGRESS   3.  Patient to improve L shoulder AROM to WNL without pain provocation to allow for increased ease of ADLs.  Baseline: pain with lowering arm, reaching behind back, doing hair, donning/doffing jacket, etc. Goal status: IN PROGRESS  4.   Patient will report 10 points on QuickDash to demonstrate improved functional ability.  Baseline: 47.7% impairment Goal status: IN PROGRESS  5.  Patient will report being able to decrease use of daily medication to manage L shoulder pain to decrease risk of side effects   Baseline: taking diclofenac 2x/day + ibuprofen for break through pain  Goal status: IN PROGRESS   PLAN:  PT FREQUENCY: 1-2x/week patient prefers 1x/week due to copay  PT DURATION: 6 weeks  PLANNED INTERVENTIONS: Therapeutic exercises, Therapeutic activity, Neuromuscular re-education, Balance training, Gait training, Patient/Family education, Self Care, Joint mobilization, Dry Needling, Electrical stimulation, Spinal manipulation, Spinal mobilization, Cryotherapy, Moist heat, Taping, Ultrasound, Ionotophoresis 4mg /ml Dexamethasone, Manual therapy, and Re-evaluation  PLAN FOR NEXT SESSION: patient agreed to DN; progress HEP for  postural strengthening, trio for shoulder impingement, ionto for shoulder pain,    Jena Gauss, PT, DPT 02/03/2023, 11:47 AM

## 2023-02-09 ENCOUNTER — Ambulatory Visit: Payer: BC Managed Care – PPO | Admitting: Physical Therapy

## 2023-02-18 ENCOUNTER — Ambulatory Visit: Payer: BC Managed Care – PPO | Attending: Sports Medicine | Admitting: Physical Therapy

## 2023-02-18 ENCOUNTER — Encounter: Payer: Self-pay | Admitting: Physical Therapy

## 2023-02-18 DIAGNOSIS — M25512 Pain in left shoulder: Secondary | ICD-10-CM | POA: Diagnosis not present

## 2023-02-18 DIAGNOSIS — G8929 Other chronic pain: Secondary | ICD-10-CM | POA: Diagnosis not present

## 2023-02-18 DIAGNOSIS — R293 Abnormal posture: Secondary | ICD-10-CM | POA: Diagnosis not present

## 2023-02-18 DIAGNOSIS — R252 Cramp and spasm: Secondary | ICD-10-CM | POA: Diagnosis not present

## 2023-02-18 DIAGNOSIS — M25532 Pain in left wrist: Secondary | ICD-10-CM | POA: Insufficient documentation

## 2023-02-18 NOTE — Therapy (Addendum)
OUTPATIENT PHYSICAL THERAPY TREATMENT Discharge Summary   Patient Name: Tiffany Peterson MRN: [id number] DOB:1970-07-03, 52 y.o., female Today's Date: 02/18/2023  END OF SESSION:  PT End of Session - 02/18/23 0800     Visit Number 4    Date for PT Re-Evaluation 03/04/23    Authorization Type BCBS    PT Start Time 0802    PT Stop Time 0849    PT Time Calculation (min) 47 min    Activity Tolerance Patient tolerated treatment well    Behavior During Therapy [facility] for tasks assessed/performed              Past Medical History:  Diagnosis Date   Anemia    Essential hypertension 08/19/2016   Morbid obesity (HCC)    Vaginal Pap smear, abnormal    colposcopy   Past Surgical History:  Procedure Laterality Date   DILITATION & CURRETTAGE/HYSTROSCOPY WITH NOVASURE ABLATION N/A 08/13/2020   Procedure: DILATATION & CURETTAGE/HYSTEROSCOPY WITH NOVASURE ABLATION, paracervical block;  Surgeon: Willodean Rosenthal, MD;  Location: [facility] OR;  Service: Gynecology;  Laterality: N/A;   IUD REMOVAL  08/13/2020   Procedure: INTRAUTERINE DEVICE (IUD) REMOVAL;  Surgeon: Willodean Rosenthal, MD;  Location: [facility] OR;  Service: Gynecology;;   KNEE ARTHROSCOPY WITH EXCISION BAKER'S CYST     TUBAL LIGATION     Patient Active Problem List   Diagnosis Date Noted   Gastroesophageal reflux disease 03/31/2021   Iron deficiency anemia due to chronic blood loss 12/07/2018   Menorrhagia 12/07/2018   Meralgia paraesthetica, right 12/01/2018   Chest pain in adult 10/12/2018   Anemia 10/11/2018   Oropharyngeal dysphagia 10/03/2018   Morbid obesity (HCC)    Hypersomnia 09/14/2016   Sinus congestion 09/14/2016   Insomnia 09/14/2016   Essential hypertension 08/19/2016   Mild dysplasia of cervix (CIN I) 12/11/2013   Right ovarian cyst 11/13/2013    PCP: Sharlene Dory, DO   REFERRING PROVIDER: Ralene Cork, DO   REFERRING DIAG: [phone] (ICD-10-CM) - Chronic left shoulder pain    THERAPY DIAG:  Chronic left shoulder pain  Cramp and spasm  Abnormal posture  Pain in left wrist  Rationale for Evaluation and Treatment: Rehabilitation  ONSET DATE: January 2024  SUBJECTIVE:                                                                                                                                                                                      SUBJECTIVE STATEMENT: Overall shoulder is doing well, biggest problem is hands still get tingling in both.  She has stopped taking the pain medication.  She did have a sharp pain in L arm shoulder to wrist  in waiting area but thinks it was because she was resting on it, stopped when she sat up. Hand dominance: Right  PERTINENT HISTORY: HTN, GERD, obesity, chronic L shoulder pain, R knee pain, anemia  PAIN:  Are you having pain? Yes: NPRS scale: 0/10 Pain location: L arm Pain description: throbbing, also gets tingling in both hands Aggravating factors: lifting, pulling Relieving factors: diclofenac, ibuprofen  PRECAUTIONS: None  RED FLAGS: None   WEIGHT BEARING RESTRICTIONS: No  FALLS:  Has patient fallen in last 6 months? No  OCCUPATION: [employer]  PLOF: Independent  PATIENT GOALS: get rid of pain  NEXT MD VISIT: as needed.   OBJECTIVE:   DIAGNOSTIC FINDINGS:  No imaging  PATIENT SURVEYS:  Quick Dash 47.7%  COGNITION: Overall cognitive status: Within functional limits for tasks assessed     SENSATION: WFL  POSTURE: Forward head, rounded shoulders  UPPER EXTREMITY ROM:   Active ROM Right eval Left eval  Shoulder flexion 140 155p!  Shoulder extension    Shoulder abduction 150 150 lowering arm hurts  Shoulder adduction    Shoulder internal rotation  Pain! Fastening bra in front  Shoulder external rotation T2 T2 pain, doing hair on R  (Blank rows = not tested)  UPPER EXTREMITY MMT:  MMT Right eval Left eval  Shoulder flexion 5 5p!  Shoulder extension    Shoulder  abduction 5 5p!  Shoulder adduction    Shoulder internal rotation 5 5  Shoulder external rotation 5 5  Middle trapezius    Lower trapezius    Elbow flexion 5 5  Elbow extension 5 5  Wrist flexion 4+ 4+  Wrist extension 4+ 4+  Grip strength (lbs) 30 15  (Blank rows = not tested)  SHOULDER SPECIAL TESTS: Impingement tests: Painful arc test: positive   PALPATION:  Tenderness L anterior shoulder at RTC attachment, biceps tendon, posterior shoulder at supraspinatus.  Tenderness and palpable trigger points L UT and levator scapulae.     TODAY'S TREATMENT:                                                                                                                                         DATE:   02/18/2023 Therapeutic Exercise: to improve strength and mobility.  Demo, verbal and tactile cues throughout for technique. UBE L1 x 6 min (78f/3b) Carpal tunnel exercises - performed bilaterally -wrist extension and flexion exercises.  -median nerve glides -tendon glides A & B Manual Therapy: to decrease muscle spasm and pain and improve mobility IASTM to bil forearms, manual edema resorption to L wrist, mobs to distal radial/ulnar joint  02/03/23 Therapeutic Exercise: to improve strength and mobility.  Demo, verbal and tactile cues throughout for technique. UBE L1 x 6 min (71f/3b) S/L open book stretches x 10 each On pool noodle - thoracic mobs - tolerated fairly Seated shoulder isometric exercises - R - flexion, extension, IR,  ER 5 x 5 sec hold each Manual Therapy: to decrease muscle spasm and pain and improve mobility STM/TPR to bil UT, L/S, cervical paraspinals, skilled palpation and monitoring during dry needling. Trigger Point Dry-Needling  Treatment instructions: Expect mild to moderate muscle soreness. S/S of pneumothorax if dry needled over a lung field, and to seek immediate medical attention should they occur. Patient verbalized understanding of these instructions and  education. Patient Consent Given: Yes Education handout provided: Yes Muscles treated: L UT, R UT, L supraspinatus Electrical stimulation performed: No Parameters: N/A Treatment response/outcome: Twitch Response Elicited and Palpable Increase in Muscle Length  01/27/2023 Therapeutic Exercise: to improve strength and mobility.  Demo, verbal and tactile cues throughout for technique. UBE L1.0 3 min fwd and 3 min back Standing rows RTB x 10 - cues for scap retraction  Standing shld ext RTB x 10  Attempted S/L exercises on L but this position increased pain Supine shoulder flexion AAROM with cane x 10  Supine L shoulder abduction AAROM with cane x 10  Supine L shoulder ER with cane x 10  Wall slides flexion and scaption x 5- pain after 5 reps Reviewed table slides   01/21/2023 Therapeutic Exercise: to improve strength and mobility.  Demo, verbal and tactile cues throughout for technique. Scap squeezes Levator stretch Upper trap stretch SCM stretch Tendon glides for wrist - demo only - do not pull on thumb if painful, wrist motions only.  Self Care: Education on medication use, side effects, wrist splints, findings, plan of care, posture, anatomy, and recommendations.    PATIENT EDUCATION: Education details: review of CPS exercises, new handout provided.  Person educated: Patient Education method: Explanation, Demonstration, Verbal cues, and Handouts Education comprehension: verbalized understanding and returned demonstration  HOME EXERCISE PROGRAM: Access Code: [email or web address] URL: [email or web address] Date: 02/03/2023 Prepared by: Harrie Foreman  Exercises - Seated Levator Scapulae Stretch  - 2 x daily - 7 x weekly - 3 reps - 30 hold - Seated Upper Trapezius Stretch  - 2 x daily - 7 x weekly - 3 reps - 30 hold - Sternocleidomastoid Stretch  - 2 x daily - 7 x weekly - 3 reps - 30 hold - Standing Bilateral Low Shoulder Row with Anchored Resistance  - 1 x daily - 3 x  weekly - 2 sets - 10 reps - Shoulder Extension with Resistance  - 1 x daily - 3 x weekly - 2 sets - 10 reps - Shoulder Flexion Wall Slide with Towel  - 1 x daily - 7 x weekly - 2 sets - 10 reps - Scaption Wall Slide with Towel  - 1 x daily - 7 x weekly - 2 sets - 10 reps - Sidelying Open Book Thoracic Lumbar Rotation and Extension  - 1 x daily - 7 x weekly - 1 sets - 10 reps - Isometric Shoulder External Rotation  - 3 x daily - 7 x weekly - 1 sets - 5 reps - 5 hold - Isometric Shoulder Internal Rotation  - 3 x daily - 7 x weekly - 1 sets - 5 reps - 5 hold - Isometric Shoulder Flexion  - 3 x daily - 7 x weekly - 1 sets - 5 reps - 5 hold  https://orthoinfo.aaos.org/globalassets/pdfs/a00789_therapeutic-exercise-program-for-carpal-tunnel_final.pdf  ASSESSMENT:  CLINICAL IMPRESSION: Zaylin Keylon reports significant improvement in L shoulder pain overall, no longer needing pain medication.  She is still having numbness and tingling in both hands, waking up with both hands tingling, and having difficulty with wrist splints  at night.  Today reviewed exercises for carpal tunnel syndrome, educated on using ice after exercises and after work to decrease inflammation, followed by manual therapy to decrease tightness in wrist extensors and flexors.  Kaylena Petsche continues to demonstrate potential for improvement and would benefit from continued skilled therapy to address impairments.       OBJECTIVE IMPAIRMENTS: decreased activity tolerance, decreased knowledge of condition, decreased knowledge of use of DME, decreased ROM, decreased strength, increased fascial restrictions, impaired perceived functional ability, increased muscle spasms, impaired UE functional use, postural dysfunction, and pain.   ACTIVITY LIMITATIONS: carrying, lifting, bending, bathing, dressing, reach over head, and hygiene/grooming  PARTICIPATION LIMITATIONS: meal prep, cleaning, laundry, shopping, and occupation  PERSONAL  FACTORS: Fitness, Time since onset of injury/illness/exacerbation, and 1-2 comorbidities: HTN, GERD, obesity, chronic L shoulder pain, R knee pain, anemia  are also affecting patient's functional outcome.   REHAB POTENTIAL: Good  CLINICAL DECISION MAKING: Evolving/moderate complexity  EVALUATION COMPLEXITY: Moderate   GOALS: Goals reviewed with patient? Yes  SHORT TERM GOALS: Target date: 02/04/2023   Patient will be independent with initial HEP.  Baseline:  Goal status: MET 02/03/23    LONG TERM GOALS: Target date: 03/04/2023  Patient will be independent with advanced/ongoing HEP to improve outcomes and carryover.  Baseline:  Goal status: IN PROGRESS 02/03/23- progressed   2.  Patient will report 75% improvement in left shoulder pain to improve QOL.  Baseline:  Goal status: IN PROGRESS  02/18/23- significant improvement, no pain meds needed now.   3.  Patient to improve L shoulder AROM to WNL without pain provocation to allow for increased ease of ADLs.  Baseline: pain with lowering arm, reaching behind back, doing hair, donning/doffing jacket, etc. Goal status: IN PROGRESS  4.   Patient will report 10 points on QuickDash to demonstrate improved functional ability.  Baseline: 47.7% impairment Goal status: IN PROGRESS  5.  Patient will report being able to decrease use of daily medication to manage L shoulder pain to decrease risk of side effects   Baseline: taking diclofenac 2x/day + ibuprofen for break through pain  Goal status: IN PROGRESS   PLAN:  PT FREQUENCY: 1-2x/week patient prefers 1x/week due to copay  PT DURATION: 6 weeks  PLANNED INTERVENTIONS: Therapeutic exercises, Therapeutic activity, Neuromuscular re-education, Balance training, Gait training, Patient/Family education, Self Care, Joint mobilization, Dry Needling, Electrical stimulation, Spinal manipulation, Spinal mobilization, Cryotherapy, Moist heat, Taping, Ultrasound, Ionotophoresis 4mg /ml Dexamethasone,  Manual therapy, and Re-evaluation  PLAN FOR NEXT SESSION: continue postural/shoulder strengthening as tolerated, manual therapy/modalities PRN.    Jena Gauss, PT, DPT 02/18/2023, 12:29 PM   PHYSICAL THERAPY DISCHARGE SUMMARY  Visits from Start of Care: 4  Current functional level related to goals / functional outcomes: Improved pain   Remaining deficits: See above   Education / Equipment: HEP  Plan: Refer to above clinical impression and goal assessment for status as of last visit on 02/18/2023. Patient returned to PT since that date, therefore will proceed with discharge from PT for this episode.      Jena Gauss, PT  8:57 AM 04/13/2023

## 2023-02-24 ENCOUNTER — Ambulatory Visit: Payer: BC Managed Care – PPO | Admitting: Physical Therapy

## 2023-08-31 ENCOUNTER — Emergency Department (HOSPITAL_BASED_OUTPATIENT_CLINIC_OR_DEPARTMENT_OTHER)

## 2023-08-31 ENCOUNTER — Encounter (HOSPITAL_BASED_OUTPATIENT_CLINIC_OR_DEPARTMENT_OTHER): Payer: Self-pay

## 2023-08-31 ENCOUNTER — Other Ambulatory Visit: Payer: Self-pay

## 2023-08-31 ENCOUNTER — Emergency Department (HOSPITAL_BASED_OUTPATIENT_CLINIC_OR_DEPARTMENT_OTHER)
Admission: EM | Admit: 2023-08-31 | Discharge: 2023-08-31 | Disposition: A | Discharge status: Home / Self Care | Attending: Emergency Medicine | Admitting: Emergency Medicine

## 2023-08-31 DIAGNOSIS — Z79899 Other long term (current) drug therapy: Secondary | ICD-10-CM | POA: Insufficient documentation

## 2023-08-31 DIAGNOSIS — M7121 Synovial cyst of popliteal space [Baker], right knee: Secondary | ICD-10-CM | POA: Diagnosis not present

## 2023-08-31 DIAGNOSIS — Z760 Encounter for issue of repeat prescription: Secondary | ICD-10-CM | POA: Diagnosis not present

## 2023-08-31 DIAGNOSIS — G8929 Other chronic pain: Secondary | ICD-10-CM | POA: Diagnosis not present

## 2023-08-31 DIAGNOSIS — M79604 Pain in right leg: Secondary | ICD-10-CM | POA: Diagnosis not present

## 2023-08-31 DIAGNOSIS — M25561 Pain in right knee: Secondary | ICD-10-CM | POA: Insufficient documentation

## 2023-08-31 DIAGNOSIS — I10 Essential (primary) hypertension: Secondary | ICD-10-CM | POA: Diagnosis not present

## 2023-08-31 DIAGNOSIS — M25461 Effusion, right knee: Secondary | ICD-10-CM | POA: Diagnosis not present

## 2023-08-31 MED ORDER — IBUPROFEN 600 MG PO TABS: 600.0000 mg | ORAL_TABLET | Freq: Four times a day (QID) | ORAL | 0 refills | Status: DC | PRN

## 2023-08-31 MED ORDER — SPIRONOLACTONE 25 MG PO TABS: 25.0000 mg | ORAL_TABLET | Freq: Every day | ORAL | 0 refills | Status: DC

## 2023-08-31 MED ORDER — AMLODIPINE BESYLATE 5 MG PO TABS: 5.0000 mg | ORAL_TABLET | Freq: Every day | ORAL | 0 refills | Status: DC

## 2023-08-31 NOTE — Discharge Instructions (Addendum)
 You were seen in the emergency room today for evaluation of your knee pain.  It shows that you have a possible Baker's cyst in the area.  This could be was causing your symptoms.  I have sent in information for a orthopedic provider for you to follow-up with.  Please make sure you call to schedule an appointment.  For pain, you can take Tylenol 1000 g and/or ibuprofen 600 mg every 6 hours as needed for pain.  I have included information for the RICE method to this discharge paperwork as well.  Please make sure you are elevating your knee whenever you are at rest.  Again, you will need to call the orthopedic doctor as this will need follow-up.  If you have any worsening pain, swelling, fevers, coloration interpreter changes, return to the ER for reevaluation.  If you have any other concerns, new or worsening symptoms, please return to your nearest emergency department for evaluation.  I have given you a 28-month supply and your blood pressure medication.  You will need to follow-up with your primary care for additional refills.  Please make sure you call to schedule an appointment.  Contact a health care provider if: The knee pain does not stop. The knee pain changes or gets worse. You have a fever along with knee pain. Your knee is red or feels warm when you touch it. Your knee gives out or locks up. Get help right away if: Your knee swells and the swelling gets worse. You cannot move your knee. You have very bad knee pain that does not get better with medicine.

## 2023-08-31 NOTE — ED Provider Notes (Signed)
 [place] Provider Note   CSN: [id number] Arrival date & time: 08/31/23  1057     History Chief Complaint  Patient presents with   Leg Pain    Right     Tiffany Peterson is a 53 y.o. female with history of hypertension presents emerged from today for evaluation of right knee pain.  Patient reports that she has had some numbness to the anterior portion of her right knee for the past 2 years.  Has followed up with her PCP regarding this.  Does not have any numbness into the lower leg, foot, or thigh.  She reports over the past 4 to 6 months she has had some worsening right knee pain.  Reports that it was bothering her today.  Denies any fevers or any injections into the knee.  Has not followed up with an orthopedic provider.  She denies any swelling into her lower legs.  She reports the pain does go very proximally into the lower leg, and very distally into the upper leg.  She reports mild swelling.  She is still ambulatory.  Hurts more with flexion extension.  She tried ibuprofen twice over the past few days for symptoms and does find some relief with this.  She denies any distal tingling or numbness.  Numbness to her anterior knee for the past years has been unchanged.  Denies any temperature or color changes.  No recent falls or injuries.  Allergic to Lipitor.  Denies any tobacco, EtOH, or drug use.   Leg Pain Associated symptoms: no fever        Home Medications Prior to Admission medications   Medication Sig Start Date End Date Taking? Authorizing Provider  amLODipine (NORVASC) 5 MG tablet Take 1 tablet (5 mg total) by mouth daily. 11/27/22   Sharlene Dory, DO  APPLE CIDER VINEGAR PO Take 3 capsules by mouth in the morning and at bedtime.    [provider]  diclofenac (VOLTAREN) 75 MG EC tablet Take 1 tablet (75mg ) twice daily with food for 5 days. Then, take 1 tablet twice daily with food as needed for pain. 01/13/23    Vonna Drafts, MD  esomeprazole (NEXIUM) 20 MG capsule Take 1 capsule (20 mg total) by mouth daily as needed (acid reflux). 12/28/22   Sharlene Dory, DO  lisinopril (ZESTRIL) 20 MG tablet Take 1 tablet (20 mg total) by mouth daily. Patient not taking: Reported on 01/21/2023 11/27/22   Sharlene Dory, DO  spironolactone (ALDACTONE) 25 MG tablet Take 1 tablet (25 mg total) by mouth daily. 12/28/22   Sharlene Dory, DO  tiZANidine (ZANAFLEX) 4 MG tablet Take 1 tablet (4 mg total) by mouth every 6 (six) hours as needed for muscle spasms. 11/27/22   Sharlene Dory, DO  vitamin B-12 (CYANOCOBALAMIN) 1000 MCG tablet Take 1,000 mcg by mouth daily.    [provider]      Allergies    Lipitor [atorvastatin]    Review of Systems   Review of Systems  Constitutional:  Negative for chills and fever.  Respiratory:  Negative for shortness of breath.   Cardiovascular:  Negative for chest pain.  Musculoskeletal:  Positive for arthralgias.  See HPI  Physical Exam Updated Vital Signs BP (!) 130/95   Pulse (!) 58   Temp 98.2 F (36.8 C)   Resp 17   Ht 5\' 5"  (1.651 m)   Wt 122.5 kg   SpO2 97%  BMI 44.93 kg/m  Physical Exam Vitals and nursing note reviewed.  Constitutional:      General: She is not in acute distress.    Appearance: She is not ill-appearing or toxic-appearing.  Eyes:     General: No scleral icterus. Cardiovascular:     Pulses:          Dorsalis pedis pulses are 2+ on the right side and 2+ on the left side.       Posterior tibial pulses are 2+ on the right side and 2+ on the left side.  Pulmonary:     Effort: Pulmonary effort is normal. No respiratory distress.  Musculoskeletal:        General: Tenderness present. No swelling.     Right lower leg: No edema.     Left lower leg: No edema.     Comments: Some tenderness into the popliteal fossa.  No overlying warmth or erythema.  No induration or fluctuance noted.  No mass palpated.  Does  have some tenderness to the medial aspect of the anterior knee.  Again, no significant overlying swelling noted.  No overlying warmth erythema.  No fluctuance induration.  No step-off or deformity.  Compartments are soft throughout.  She does report some decrease in sensation to the anterior knee.  Sensation reportedly intact in the upper and lower legs.  DP and PT pulses palpable 2+ symmetric.  Patient does have active flexion and extension with some pain.  Passive range of motion intact.  Skin:    General: Skin is warm.  Neurological:     Mental Status: She is alert.     ED Results / Procedures / Treatments   Labs (all labs ordered are listed, but only abnormal results are displayed) Labs Reviewed - No data to display  EKG None  Radiology US Venous Img Lower Unilateral Right Result Date: 08/31/2023 CLINICAL DATA:  Right leg pain EXAM: Right LOWER EXTREMITY VENOUS DOPPLER ULTRASOUND TECHNIQUE: Gray-scale sonography with compression, as well as color and duplex ultrasound, were performed to evaluate the deep venous system(s) from the level of the common femoral vein through the popliteal and proximal calf veins. COMPARISON:  None Available. FINDINGS: VENOUS Normal compressibility of the common femoral, superficial femoral, and popliteal veins, as well as the visualized calf veins. Visualized portions of profunda femoral vein and great saphenous vein unremarkable. No filling defects to suggest DVT on grayscale or color Doppler imaging. Doppler waveforms show normal direction of venous flow, normal respiratory plasticity and response to augmentation. Limited views of the contralateral common femoral vein are unremarkable. OTHER Small fluid collection in the popliteal fossa measuring 3.4 x 1.2 x 2.1 cm. No blood flow on Doppler. Limitations: none IMPRESSION: No evidence of right lower extremity DVT. Small fluid collection in the popliteal fossa. Possible Baker's cyst. Confirmatory study as clinically  appropriate. Electronically Signed   By: Karen Kays M.D.   On: 08/31/2023 14:09   DG Knee Complete 4 Views Right Result Date: 08/31/2023 CLINICAL DATA:  Knee pain for 3 months. EXAM: RIGHT KNEE - COMPLETE 4 VIEW COMPARISON:  None Available. FINDINGS: No fracture or dislocation. Preserved joint spaces and bone mineralization. Small effusion. IMPRESSION: Small joint effusion. Electronically Signed   By: Karen Kays M.D.   On: 08/31/2023 14:08    Procedures Procedures   Medications Ordered in ED Medications - No data to display  ED Course/ Medical Decision Making/ A&P  Medical Decision Making Amount and/or Complexity of Data Reviewed Radiology: ordered.  Risk Prescription drug management.   53 y.o. female presents to the ER for evaluation of right knee pain. Differential diagnosis includes but is not limited to arthritis, septic arthritis, DVT. Vital signs blood pressure 130/95, pulse rate of 58, otherwise unremarkable. Physical exam as noted above.   I do not appreciate any significant lower leg swelling.  She is neurovascular tact distally.  Will order x-ray and ultrasound.  XR shows small joint effusion. Per radiologist's interpretation.    US shows  No evidence of right lower extremity DVT. Small fluid collection in the popliteal fossa. Possible Baker's cyst. Confirmatory study as clinically appropriate. Per radiologist's interpretation.    Patient likely having some symptoms due to the Baker's cyst.  He states that he is my attending.  Does not recommend any other intervention and can follow-up outpatient with orthopedics.  Will give the patient refills on her blood pressure medication.  Blood pressure is not extremely abnormal given that it is 130/95.  Patient reports that she only has her spironolactone and slowly been taking that not the amlodipine.  Her compartments are soft again and she is neurovascular intact distally.  She can flex and  extend.  There is no overlying warmth or erythema.  I doubt any septic arthritis. Pain is chronic in nature. She stable for discharge home with close outpatient follow-up.  We discussed the results of the labs/imaging. The plan is RICE, supportive care, follow up with orthopedic. We discussed strict return precautions and red flag symptoms. The patient verbalized their understanding and agrees to the plan. The patient is stable and being discharged home in good condition.  I discussed this case with my attending physician who cosigned this note including patient's presenting symptoms, physical exam, and planned diagnostics and interventions. Attending physician stated agreement with plan or made changes to plan which were implemented.   Portions of this report may have been transcribed using voice recognition software. Every effort was made to ensure accuracy; however, inadvertent computerized transcription errors may be present.    Final Clinical Impression(s) / ED Diagnoses Final diagnoses:  Chronic pain of right knee  Synovial cyst of right popliteal space  Medication refill    Rx / DC Orders ED Discharge Orders          Ordered    ibuprofen (ADVIL) 600 MG tablet  Every 6 hours PRN        08/31/23 1704    spironolactone (ALDACTONE) 25 MG tablet  Daily        08/31/23 1704    amLODipine (NORVASC) 5 MG tablet  Daily        08/31/23 1704              Achille Rich, PA-C 08/31/23 1916    Virgina Norfolk, DO 09/01/23 [phone]

## 2023-08-31 NOTE — ED Triage Notes (Addendum)
 Patient arrives ambulatory to the ED with complaints of intermittent right leg numbness for 2 years. Now developing pain in her leg x2 months. No falls or injuries. Rates her pain a 6/10.  ---patient also reports being out of her BP meds x1 month. Needing a refill.

## 2023-08-31 NOTE — ED Notes (Signed)
 Pt transported to imaging.

## 2023-09-01 ENCOUNTER — Encounter: Payer: Self-pay | Admitting: Physician Assistant

## 2023-09-01 ENCOUNTER — Ambulatory Visit: Admitting: Physician Assistant

## 2023-09-01 VITALS — BP 159/105 | HR 77 | Ht 65.0 in | Wt 268.8 lb

## 2023-09-01 DIAGNOSIS — I10 Essential (primary) hypertension: Secondary | ICD-10-CM | POA: Diagnosis not present

## 2023-09-01 DIAGNOSIS — M7121 Synovial cyst of popliteal space [Baker], right knee: Secondary | ICD-10-CM

## 2023-09-01 NOTE — Assessment & Plan Note (Signed)
 Significantly elevated in office. Pt reports she is compliant with her meds, manages with amlodipine 5 mg and spironolactone 25 mg . Encouraged her to check at home, and f/b with pcp.

## 2023-09-01 NOTE — Progress Notes (Signed)
 Established patient visit   Patient: Tiffany Peterson   DOB: 01/16/1971   53 y.o. Female  MRN: [id number] Visit Date: 09/01/2023  Today's healthcare provider: Alfredia Ferguson, PA-C   Cc. ED f/u  Subjective     Pt was seen in the ED 08/31/23 for right knee pain and swelling. Xray demonstrated small joint effusion, [place] with no LE DVT, small fluid collection in popliteal fossa w/ a possible baker's cyst.  Pt was referred to orthopedics. She has not yet made an appointment.   She reports improvement in pain from resting and taking ibuprofen/tylenol over the counter.  She reports needing FMLA paperwork to cover time off.  Medications: Outpatient Medications Prior to Visit  Medication Sig   amLODipine (NORVASC) 5 MG tablet Take 1 tablet (5 mg total) by mouth daily.   APPLE CIDER VINEGAR PO Take 3 capsules by mouth in the morning and at bedtime.   esomeprazole (NEXIUM) 20 MG capsule Take 1 capsule (20 mg total) by mouth daily as needed (acid reflux).   ibuprofen (ADVIL) 600 MG tablet Take 1 tablet (600 mg total) by mouth every 6 (six) hours as needed.   spironolactone (ALDACTONE) 25 MG tablet Take 1 tablet (25 mg total) by mouth daily.   tiZANidine (ZANAFLEX) 4 MG tablet Take 1 tablet (4 mg total) by mouth every 6 (six) hours as needed for muscle spasms.   vitamin B-12 (CYANOCOBALAMIN) 1000 MCG tablet Take 1,000 mcg by mouth daily.   No facility-administered medications prior to visit.    Review of Systems  Constitutional:  Negative for fatigue and fever.  Respiratory:  Negative for cough and shortness of breath.   Cardiovascular:  Negative for chest pain and leg swelling.  Gastrointestinal:  Negative for abdominal pain.  Musculoskeletal:  Positive for arthralgias.  Neurological:  Negative for dizziness and headaches.       Objective    BP (!) 159/105   Pulse 77   Ht 5\' 5"  (1.651 m)   Wt 268 lb 12.8 oz (121.9 kg)   BMI 44.73 kg/m    Physical Exam Vitals reviewed.   Constitutional:      Appearance: She is not ill-appearing.  HENT:     Head: Normocephalic.  Eyes:     Conjunctiva/sclera: Conjunctivae normal.  Cardiovascular:     Rate and Rhythm: Normal rate.  Pulmonary:     Effort: Pulmonary effort is normal. No respiratory distress.  Musculoskeletal:     Comments: No acute edema to R knee pt able to ambulate w/o issue  Neurological:     Mental Status: She is alert and oriented to person, place, and time.  Psychiatric:        Mood and Affect: Mood normal.        Behavior: Behavior normal.     No results found for any visits on 09/01/23.  Assessment & Plan    Baker's cyst of knee, right Per [place] report. Recommending cont NSAIDs for a total of 5 days, ice, elevate. Given a knee sleeve today instead of her ace bandage.  Recommending f/u with ortho . Discussed FMLA through 3/18- 3/30. Pt provided note, advised to f/b with further FMLA paperwork.  Essential hypertension Assessment & Plan: Significantly elevated in office. Pt reports she is compliant with her meds, manages with amlodipine 5 mg and spironolactone 25 mg . Encouraged her to check at home, and f/b with pcp.  Return in about 4 weeks (around 09/29/2023), or if symptoms worsen or  fail to improve, for hypertension w/ pcp.       Alfredia Ferguson, PA-C  [facility] at [facility] [phone] (phone) [phone] (fax)  [facility]

## 2023-09-03 ENCOUNTER — Telehealth: Payer: Self-pay | Admitting: Emergency Medicine

## 2023-09-03 DIAGNOSIS — M1711 Unilateral primary osteoarthritis, right knee: Secondary | ICD-10-CM | POA: Diagnosis not present

## 2023-09-03 NOTE — Telephone Encounter (Signed)
 Forms faxed and pt notified.  Copy of forms on my desk for pt to pick up on Monday.

## 2023-09-03 NOTE — Telephone Encounter (Signed)
 Copied from CRM [id number]. Topic: General - Other >> Sep 03, 2023  2:10 PM Sim Boast F wrote: Reason for CRM: Patient called to follow up on fmla paperwork, wants to make sure that its fax back to [facility] she has an appointment Monday and would like to have the copy. Thank you.

## 2023-09-06 ENCOUNTER — Ambulatory Visit: Admitting: Family Medicine

## 2023-09-06 VITALS — BP 138/86 | HR 63 | Temp 97.7°F | Ht 65.0 in | Wt 267.4 lb

## 2023-09-06 DIAGNOSIS — I10 Essential (primary) hypertension: Secondary | ICD-10-CM | POA: Diagnosis not present

## 2023-09-06 MED ORDER — ZEPBOUND 2.5 MG/0.5ML ~~LOC~~ SOAJ: 2.5000 mg | SUBCUTANEOUS | 0 refills | Status: DC

## 2023-09-06 MED ORDER — ZEPBOUND 15 MG/0.5ML ~~LOC~~ SOAJ: 15.0000 mg | SUBCUTANEOUS | 1 refills | Status: DC

## 2023-09-06 MED ORDER — ZEPBOUND 10 MG/0.5ML ~~LOC~~ SOAJ: 10.0000 mg | SUBCUTANEOUS | 0 refills | Status: DC

## 2023-09-06 MED ORDER — ZEPBOUND 7.5 MG/0.5ML ~~LOC~~ SOAJ: 7.5000 mg | SUBCUTANEOUS | 0 refills | Status: DC

## 2023-09-06 MED ORDER — AMLODIPINE BESYLATE 10 MG PO TABS: 10.0000 mg | ORAL_TABLET | Freq: Every day | ORAL | 2 refills | Status: AC

## 2023-09-06 MED ORDER — ZEPBOUND 5 MG/0.5ML ~~LOC~~ SOAJ: 5.0000 mg | SUBCUTANEOUS | 0 refills | Status: DC

## 2023-09-06 MED ORDER — ZEPBOUND 12.5 MG/0.5ML ~~LOC~~ SOAJ: 12.5000 mg | SUBCUTANEOUS | 0 refills | Status: DC

## 2023-09-06 NOTE — Patient Instructions (Addendum)
 Keep the diet clean and stay active.  Check your blood pressures 2-3 times per week, alternating the time of day you check it. If it is high, considering waiting 1-2 minutes and rechecking. If it gets higher, your anxiety is likely creeping up and we should avoid rechecking.   Try some 1% cortisone cream.   Consider taking Allegra or Claritin to help with the itching also.   Let me know if there are cost issues.   Let us know if you need anything.

## 2023-09-06 NOTE — Progress Notes (Signed)
 Chief Complaint  Patient presents with   Hypertension    Patient presents today for hypertension follow-up.    Subjective Tiffany Peterson is a 53 y.o. female who presents for hypertension follow up. She does not monitor home blood pressures. She was not compliant with medications. Patient has these side effects of medication: none She is not adhering to a healthy diet overall. Current exercise: none No CP or SOB.   Pt struggling w wt and implying about medication options. She does not have a hx of DM. Has never been thru a supervised wt loss program. Has never been on a medication. Goal weight is 200 lbs.    Past Medical History:  Diagnosis Date   Anemia    Essential hypertension 08/19/2016   Morbid obesity (HCC)    Vaginal Pap smear, abnormal    colposcopy    Exam BP 138/86   Pulse 63   Temp 97.7 F (36.5 C)   Ht 5\' 5"  (1.651 m)   Wt 267 lb 6.4 oz (121.3 kg)   SpO2 97%   BMI 44.50 kg/m  General:  well developed, well nourished, in no apparent distress Heart: RRR, no bruits Lungs: clear to auscultation, no accessory muscle use Psych: well oriented with normal range of affect and appropriate judgment/insight  Essential hypertension  Morbid obesity (HCC) - Plan: tirzepatide (ZEPBOUND) 10 MG/0.5ML Pen, tirzepatide (ZEPBOUND) 12.5 MG/0.5ML Pen, tirzepatide (ZEPBOUND) 15 MG/0.5ML Pen, tirzepatide (ZEPBOUND) 2.5 MG/0.5ML Pen, tirzepatide (ZEPBOUND) 5 MG/0.5ML Pen, tirzepatide (ZEPBOUND) 7.5 MG/0.5ML Pen  Chronic, not ideally controlled.  Continue spironolactone 25 mg daily, increase amlodipine from 5 mg daily to 10 mg daily.  Monitor blood pressure at home.  Counseled on diet and exercise. Chronic, not controlled.  Start Zepbound 2.5 mg weekly and titrate up.  She will let me know if there are cost issues. F/u in 2 months to recheck above. The patient voiced understanding and agreement to the plan.  Jilda Roche [place], DO 09/06/23  11:57 AM

## 2023-09-09 ENCOUNTER — Ambulatory Visit (HOSPITAL_BASED_OUTPATIENT_CLINIC_OR_DEPARTMENT_OTHER)
Admission: RE | Admit: 2023-09-09 | Discharge: 2023-09-09 | Disposition: A | Discharge status: Home / Self Care | Source: Ambulatory Visit | Attending: Physician Assistant | Admitting: Physician Assistant

## 2023-09-09 ENCOUNTER — Other Ambulatory Visit (HOSPITAL_COMMUNITY): Payer: Self-pay

## 2023-09-09 ENCOUNTER — Encounter: Payer: Self-pay | Admitting: Physician Assistant

## 2023-09-09 ENCOUNTER — Ambulatory Visit: Admitting: Physician Assistant

## 2023-09-09 ENCOUNTER — Telehealth: Payer: Self-pay | Admitting: Pharmacy Technician

## 2023-09-09 VITALS — BP 112/74 | HR 64 | Temp 98.2°F | Resp 18 | Ht 65.0 in | Wt 268.2 lb

## 2023-09-09 DIAGNOSIS — M7121 Synovial cyst of popliteal space [Baker], right knee: Secondary | ICD-10-CM | POA: Diagnosis not present

## 2023-09-09 NOTE — Progress Notes (Signed)
 Established patient visit   Patient: Tiffany Peterson   DOB: 12/10/1970   53 y.o. Female  MRN: [id number] Visit Date: 09/09/2023  Today's healthcare provider: Alfredia Ferguson, PA-C   Chief Complaint  Patient presents with   Follow-up    Bakers cyst- still having some pain, swelling   Subjective     Pt was seen by myself 3/19 for a ED follow up for right knee pain suspected to be secondary to a bakers cyst. She was given a knee sleeve, recommended rest, and ice.  Today, pt reports some residual pain, wanting another ultrasound to confirm the presence or absence of cyst. She saw [facility] this week, but they were unable to do an ultrasound. She was prescribed a steroid course to help with the pain.  Medications: Outpatient Medications Prior to Visit  Medication Sig   amLODipine (NORVASC) 10 MG tablet Take 1 tablet (10 mg total) by mouth daily.   APPLE CIDER VINEGAR PO Take 3 capsules by mouth in the morning and at bedtime.   esomeprazole (NEXIUM) 20 MG capsule Take 1 capsule (20 mg total) by mouth daily as needed (acid reflux).   ibuprofen (ADVIL) 600 MG tablet Take 1 tablet (600 mg total) by mouth every 6 (six) hours as needed.   spironolactone (ALDACTONE) 25 MG tablet Take 1 tablet (25 mg total) by mouth daily.   [START ON 11/29/2023] tirzepatide (ZEPBOUND) 10 MG/0.5ML Pen Inject 10 mg into the skin once a week for 28 days.   vitamin B-12 (CYANOCOBALAMIN) 1000 MCG tablet Take 1,000 mcg by mouth daily.   [DISCONTINUED] tirzepatide (ZEPBOUND) 12.5 MG/0.5ML Pen Inject 12.5 mg into the skin once a week for 28 days.   [DISCONTINUED] tirzepatide (ZEPBOUND) 15 MG/0.5ML Pen Inject 15 mg into the skin once a week.   [DISCONTINUED] tirzepatide (ZEPBOUND) 2.5 MG/0.5ML Pen Inject 2.5 mg into the skin once a week for 28 days.   [DISCONTINUED] tirzepatide (ZEPBOUND) 5 MG/0.5ML Pen Inject 5 mg into the skin once a week for 28 days.   [DISCONTINUED] tirzepatide (ZEPBOUND) 7.5 MG/0.5ML Pen  Inject 7.5 mg into the skin once a week for 28 days.   No facility-administered medications prior to visit.    Review of Systems  Constitutional:  Negative for fatigue and fever.  Respiratory:  Negative for cough and shortness of breath.   Cardiovascular:  Negative for chest pain and leg swelling.  Gastrointestinal:  Negative for abdominal pain.  Musculoskeletal:  Positive for arthralgias.  Neurological:  Negative for dizziness and headaches.       Objective    BP 112/74   Pulse 64   Temp 98.2 F (36.8 C) (Oral)   Resp 18   Ht 5\' 5"  (1.651 m)   Wt 268 lb 4 oz (121.7 kg)   LMP 01/05/2019   SpO2 98%   BMI 44.64 kg/m    Physical Exam Vitals reviewed.  Constitutional:      Appearance: She is not ill-appearing.  HENT:     Head: Normocephalic.  Eyes:     Conjunctiva/sclera: Conjunctivae normal.  Cardiovascular:     Rate and Rhythm: Normal rate.  Pulmonary:     Effort: Pulmonary effort is normal. No respiratory distress.  Neurological:     Mental Status: She is alert and oriented to person, place, and time.  Psychiatric:        Mood and Affect: Mood normal.        Behavior: Behavior normal.  No results found for any visits on 09/09/23.  Assessment & Plan    Baker's cyst of knee, right -     US SOFT TISSUE LOWER EXTREMITY LIMITED RIGHT (NON-VASCULAR); Future  Ordered repeat US.  Cont treatment as prescribed.   Alfredia Ferguson, PA-C  [facility] at [facility] [phone] (phone) [phone] (fax)  [facility]

## 2023-09-09 NOTE — Telephone Encounter (Signed)
 Pharmacy Patient Advocate Encounter   Received notification from CoverMyMeds that prior authorization for Zepbound 2.5MG /0.5ML pen-injectors is required/requested.   Insurance verification completed.   The patient is insured through [employer] .   Per test claim: [id number]  Submitted and pending

## 2023-09-09 NOTE — Telephone Encounter (Signed)
 Pharmacy Patient Advocate Encounter  Received notification from [employer] that Prior Authorization for Zepbound 2.5MG /0.5ML pen-injectors has been APPROVED from 08/10/23 to 05/06/24. Ran test claim, Copay is $35.00. This test claim was processed through [facility]- copay amounts may vary at other pharmacies due to pharmacy/plan contracts, or as the patient moves through the different stages of their insurance plan.   PA #/Case ID/Reference #: [id number]

## 2023-09-14 ENCOUNTER — Encounter: Payer: Self-pay | Admitting: Physician Assistant

## 2023-09-14 ENCOUNTER — Ambulatory Visit: Admitting: Physician Assistant

## 2023-09-14 VITALS — BP 130/79 | HR 71 | Ht 65.0 in | Wt 268.4 lb

## 2023-09-14 DIAGNOSIS — M7121 Synovial cyst of popliteal space [Baker], right knee: Secondary | ICD-10-CM | POA: Diagnosis not present

## 2023-09-14 NOTE — Progress Notes (Signed)
      Established patient visit   Patient: Tiffany Peterson   DOB: 05/08/1971   53 y.o. Female  MRN: [id number] Visit Date: 09/14/2023  Today's healthcare provider: Alfredia Ferguson, PA-C   Cc. Return to work  Subjective     Pt reports for return to work note. She reports still having some residual pain. Wanting [place] results.  Medications: Outpatient Medications Prior to Visit  Medication Sig   amLODipine (NORVASC) 10 MG tablet Take 1 tablet (10 mg total) by mouth daily.   APPLE CIDER VINEGAR PO Take 3 capsules by mouth in the morning and at bedtime.   esomeprazole (NEXIUM) 20 MG capsule Take 1 capsule (20 mg total) by mouth daily as needed (acid reflux).   ibuprofen (ADVIL) 600 MG tablet Take 1 tablet (600 mg total) by mouth every 6 (six) hours as needed.   spironolactone (ALDACTONE) 25 MG tablet Take 1 tablet (25 mg total) by mouth daily.   [START ON 11/29/2023] tirzepatide (ZEPBOUND) 10 MG/0.5ML Pen Inject 10 mg into the skin once a week for 28 days.   vitamin B-12 (CYANOCOBALAMIN) 1000 MCG tablet Take 1,000 mcg by mouth daily.   No facility-administered medications prior to visit.    Review of Systems  Constitutional:  Negative for fatigue and fever.  Respiratory:  Negative for cough and shortness of breath.   Cardiovascular:  Negative for chest pain and leg swelling.  Gastrointestinal:  Negative for abdominal pain.  Musculoskeletal:  Positive for arthralgias.  Neurological:  Negative for dizziness and headaches.       Objective    BP 130/79   Pulse 71   Ht 5\' 5"  (1.651 m)   Wt 268 lb 6.4 oz (121.7 kg)   LMP 01/05/2019   BMI 44.66 kg/m    Physical Exam Vitals reviewed.  Constitutional:      Appearance: She is not ill-appearing.  HENT:     Head: Normocephalic.  Eyes:     Conjunctiva/sclera: Conjunctivae normal.  Cardiovascular:     Rate and Rhythm: Normal rate.  Pulmonary:     Effort: Pulmonary effort is normal. No respiratory distress.  Neurological:      Mental Status: She is alert and oriented to person, place, and time.  Psychiatric:        Mood and Affect: Mood normal.        Behavior: Behavior normal.      No results found for any visits on 09/14/23.  Assessment & Plan    Baker's cyst of knee, right  Pt is cleared to return to work w/o restrictions. [place] report is not back yet, but from review of the images it does appear she still has a baker's cyst. Recommending continued f/u with ortho if she continues to have pain.  Return if symptoms worsen or fail to improve.       Alfredia Ferguson, PA-C  [facility] at [facility] [phone] (phone) [phone] (fax)  [facility]

## 2023-09-20 ENCOUNTER — Encounter: Payer: Self-pay | Admitting: Physician Assistant

## 2023-09-20 ENCOUNTER — Other Ambulatory Visit (HOSPITAL_COMMUNITY): Payer: Self-pay

## 2023-09-20 DIAGNOSIS — M25561 Pain in right knee: Secondary | ICD-10-CM | POA: Diagnosis not present

## 2023-10-12 ENCOUNTER — Telehealth: Payer: Self-pay

## 2023-10-12 NOTE — Telephone Encounter (Signed)
 Copied from CRM [id number]. Topic: Clinical - Medication Question >> Oct 11, 2023  4:08 PM Tiffany Peterson wrote: Reason for CRM: Patient called in regarding tirzepatide  (ZEPBOUND ) wanted to know if she needs to call in every time she gets it or does the pharmacy have it

## 2023-10-13 DIAGNOSIS — M25561 Pain in right knee: Secondary | ICD-10-CM | POA: Diagnosis not present

## 2023-10-19 ENCOUNTER — Telehealth: Payer: Self-pay | Admitting: Family Medicine

## 2023-10-19 NOTE — Telephone Encounter (Signed)
 Copied from CRM [id number]. 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 She would like to change the return date from 09/14/23 to 09/15/23 due to her schedule being third shift and when she clocks out it's after midnight so it counts as 09/15/23.  Patient would like it faxed to [phone] As well as sent to her through MyChart.

## 2023-10-19 NOTE — Telephone Encounter (Signed)
 Called spoke with pt and dates has been changed. Fax letter and update in [place].

## 2023-10-22 DIAGNOSIS — M2241 Chondromalacia patellae, right knee: Secondary | ICD-10-CM | POA: Diagnosis not present

## 2023-10-29 ENCOUNTER — Telehealth: Payer: Self-pay | Admitting: Family Medicine

## 2023-10-29 NOTE — Telephone Encounter (Signed)
 Copied from CRM [id number]. 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 She would like to change the return date from 09/14/23 to 09/15/23 due to her schedule being third shift and when she clocks out it's after midnight so it counts as 09/15/23.  Patient would like it faxed to [phone] As well as sent to her through MyChart. >> Oct 29, 2023  1:01 PM Magdalene School wrote: Patient calling because she received a letter stating   "The above named patient had a medical visit today at: [facility]. Please excuse her for work missed 3/18825-09/15/2023. Formal FMLA paperwork to follow."  But she has not received the corrected FMLA paperwork.

## 2023-11-01 NOTE — Telephone Encounter (Signed)
 Letter has been updated and sent pt mychart message letting her know it had been updated.

## 2023-11-03 ENCOUNTER — Other Ambulatory Visit: Payer: Self-pay | Admitting: Family Medicine

## 2023-11-03 ENCOUNTER — Ambulatory Visit: Admitting: Family Medicine

## 2023-11-03 ENCOUNTER — Other Ambulatory Visit (HOSPITAL_BASED_OUTPATIENT_CLINIC_OR_DEPARTMENT_OTHER): Payer: Self-pay

## 2023-11-03 ENCOUNTER — Encounter: Payer: Self-pay | Admitting: Family Medicine

## 2023-11-03 MED ORDER — ZEPBOUND 12.5 MG/0.5ML ~~LOC~~ SOAJ
12.5000 mg | SUBCUTANEOUS | 0 refills | Status: DC
  Filled 2023-11-03: qty 2, 28d supply, fill #0

## 2023-11-03 MED ORDER — ZEPBOUND 15 MG/0.5ML ~~LOC~~ SOAJ
15.0000 mg | SUBCUTANEOUS | 1 refills | Status: DC
  Filled 2023-11-03: qty 6, 84d supply, fill #0

## 2023-11-03 MED ORDER — ZEPBOUND 5 MG/0.5ML ~~LOC~~ SOAJ: 5.0000 mg | SUBCUTANEOUS | 0 refills | Status: DC

## 2023-11-03 MED ORDER — ZEPBOUND 12.5 MG/0.5ML ~~LOC~~ SOAJ: 12.5000 mg | SUBCUTANEOUS | 0 refills | Status: DC

## 2023-11-03 MED ORDER — ZEPBOUND 10 MG/0.5ML ~~LOC~~ SOAJ
10.0000 mg | SUBCUTANEOUS | 0 refills | Status: DC
  Filled 2023-11-03: qty 2, 28d supply, fill #0

## 2023-11-03 MED ORDER — ZEPBOUND 15 MG/0.5ML ~~LOC~~ SOAJ: 15.0000 mg | SUBCUTANEOUS | 1 refills | Status: DC

## 2023-11-03 MED ORDER — ZEPBOUND 2.5 MG/0.5ML ~~LOC~~ SOAJ: 2.5000 mg | SUBCUTANEOUS | 0 refills | Status: AC

## 2023-11-03 MED ORDER — ZEPBOUND 5 MG/0.5ML ~~LOC~~ SOAJ
5.0000 mg | SUBCUTANEOUS | 0 refills | Status: DC
Start: 2023-12-01 — End: 2023-11-03
  Filled 2023-11-03: qty 2, 28d supply, fill #0

## 2023-11-03 MED ORDER — ZEPBOUND 10 MG/0.5ML ~~LOC~~ SOAJ: 10.0000 mg | SUBCUTANEOUS | 0 refills | Status: DC

## 2023-11-03 MED ORDER — ZEPBOUND 2.5 MG/0.5ML ~~LOC~~ SOAJ
2.5000 mg | SUBCUTANEOUS | 0 refills | Status: DC
  Filled 2023-11-03: qty 2, 28d supply, fill #0

## 2023-11-03 MED ORDER — ZEPBOUND 7.5 MG/0.5ML ~~LOC~~ SOAJ: 7.5000 mg | SUBCUTANEOUS | 0 refills | Status: DC

## 2023-11-03 MED ORDER — ZEPBOUND 7.5 MG/0.5ML ~~LOC~~ SOAJ
7.5000 mg | SUBCUTANEOUS | 0 refills | Status: DC
  Filled 2023-11-03: qty 2, 28d supply, fill #0

## 2023-11-03 NOTE — Progress Notes (Signed)
 Chief Complaint  Patient presents with   Follow-up    Follow up    Subjective: Patient is a 53 y.o. female here for f/u obesity.  She started on Zepbound  2.5 mg/week for 4 weeks. No AE's, compliant. Pharmacy did not apparently receive the next doses despite it being sent. She has been off of it for around 1 mo. Diet is OK. She is not exercising.   Past Medical History:  Diagnosis Date   Anemia    Essential hypertension 08/19/2016   Morbid obesity (HCC)    Vaginal Pap smear, abnormal    colposcopy    Objective: BP 128/80 (BP Location: Left Arm, Patient Position: Sitting)   Pulse 81   Temp 98 F (36.7 C) (Oral)   Resp 16   Ht 5\' 5"  (1.651 m)   Wt 270 lb (122.5 kg)   LMP 01/05/2019   SpO2 98%   BMI 44.93 kg/m  General: Awake, appears stated age Heart: RRR Lungs: CTAB, no rales, wheezes or rhonchi. No accessory muscle use Psych: Age appropriate judgment and insight, normal affect and mood  Assessment and Plan: Morbid obesity (HCC) - Plan: tirzepatide  (ZEPBOUND ) 10 MG/0.5ML Pen, tirzepatide  (ZEPBOUND ) 12.5 MG/0.5ML Pen, tirzepatide  (ZEPBOUND ) 15 MG/0.5ML Pen, tirzepatide  (ZEPBOUND ) 2.5 MG/0.5ML Pen, tirzepatide  (ZEPBOUND ) 5 MG/0.5ML Pen, tirzepatide  (ZEPBOUND ) 7.5 MG/0.5ML Pen  Chronic, not stable.  Unfortunately there was a mixup at the pharmacy.  I will resend the Zepbound  2.5 mg daily and titrate up again.  Counseled on diet and exercise.  Recommended chair yoga as she does have a bad knee and is following with orthopedics for this.  She will send me a message or have the pharmacy reach out to me if there are any issues this time around.  Follow-up in 2 months to recheck. The patient voiced understanding and agreement to the plan.  Shellie Dials [place], DO 11/03/23  7:53 AM

## 2023-11-03 NOTE — Patient Instructions (Addendum)
 Keep the diet clean and stay active.  Send me a MyChart message if there are any issues with the medicine this time around.   Aim to do some physical exertion for 150 minutes per week. This is typically divided into 5 days per week, 30 minutes per day. The activity should be enough to get your heart rate up. Anything is better than nothing if you have time constraints.  Consider chair yoga.   Let us  know if you need anything.

## 2023-11-23 ENCOUNTER — Encounter: Payer: Self-pay | Admitting: Physician Assistant

## 2023-11-23 ENCOUNTER — Ambulatory Visit: Admitting: Physician Assistant

## 2023-11-23 ENCOUNTER — Ambulatory Visit: Payer: Self-pay

## 2023-11-23 VITALS — BP 144/79 | HR 67 | Wt 271.6 lb

## 2023-11-23 DIAGNOSIS — G8929 Other chronic pain: Secondary | ICD-10-CM

## 2023-11-23 DIAGNOSIS — M25561 Pain in right knee: Secondary | ICD-10-CM

## 2023-11-23 NOTE — Telephone Encounter (Signed)
 FYI Only or Action Required?: FYI only for provider  Patient was last seen in primary care on 11/03/2023 by Jobe Mulder, DO. Called Nurse Triage reporting Knee Pain. Symptoms began several months ago. Interventions attempted: OTC medications: See note . Symptoms are: gradually worsening.  Triage Disposition: See HCP Within 4 Hours (Or PCP Triage)- Scheduled   Patient/caregiver understands and will follow disposition?: Yes        Copied from CRM [id number]. Topic: Clinical - Red Word Triage >> Nov 23, 2023  1:14 PM Tiffany Peterson wrote: Red Word that prompted transfer to Nurse Triage: Patient having severe pain in right leg, says she suppose to have procedure with [facility] but they are waiting to hear back from Dr. Gwenette Lennox . Reason for Disposition . [1] SEVERE pain (e.g., excruciating, unable to walk) AND [2] not improved after 2 hours of pain medicine  Answer Assessment - Initial Assessment Questions 1. LOCATION and RADIATION: "Where is the pain located?"      ------------- R. Knee pain  ( Knee down to calf)     2. QUALITY: "What does the pain feel like?"  (e.g., sharp, dull, aching, burning)     -- sharp     3. SEVERITY: "How bad is the pain?" "What does it keep you from doing?"   (Scale 1-10; or mild, moderate, severe)   -  MILD (1-3): doesn't interfere with normal activities    -  MODERATE (4-7): interferes with normal activities (e.g., work or school) or awakens from sleep, limping    -  SEVERE (8-10): excruciating pain, unable to do any normal activities, unable to walk      ------------------8/10 : Painful walking/bending    4. ONSET: "When did the pain start?" "Does it come and go, or is it there all the time?"     ------------------------Before May    5. RECURRENT: "Have you had this pain before?" If Yes, ask: "When, and what happened then?"     ----------------------  6. SETTING: "Has there been any recent work, exercise or other activity that involved  that part of the body?"      ----------------------   7. AGGRAVATING FACTORS: "What makes the knee pain worse?" (e.g., walking, climbing stairs, running)     ----------------   8. ASSOCIATED SYMPTOMS: "Is there any swelling or redness of the knee?"     ------------- Yes, swelling    9. OTHER SYMPTOMS: "Do you have any other symptoms?" (e.g., chest pain, difficulty breathing, fever, calf pain)  ------------ " Feels tight"  --------------Some pain in calf ( not acute )     Additional Info:   Reports having a Bakers cyst Attempted to use Acetamin/Ibupr- mild relief  Cortisone shot 05/09- relief noted, but pain has now returned. --- May need surgery, but is "waiting for Dr. Theodoro Fisherman office. For insurance purposes".  Protocols used: Knee Pain-A-AH

## 2023-11-23 NOTE — Progress Notes (Signed)
 Established patient visit   Patient: Tiffany Peterson   DOB: 1971-05-29   53 y.o. Female  MRN: [id number] Visit Date: 11/23/2023  Today's healthcare provider: Trenton Frock, PA-C   Cc. Continued right knee pain  Subjective     Discussed the use of AI scribe software for clinical note transcription with the patient, who gave verbal consent to proceed.  History of Present Illness    She experiences significant knee pain due to a torn meniscus, worsened since the effects of a recent injection on May 9th have worn off. Pain is exacerbated by knee bending and is accompanied by swelling and a persistent cyst. Pain is localized around the knee and intensifies with movement.  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 She notes popping sounds in her knee when walking.   She wishes to discuss time off from work.     Medications: Outpatient Medications Prior to Visit  Medication Sig   amLODipine  (NORVASC ) 10 MG tablet Take 1 tablet (10 mg total) by mouth daily.   esomeprazole  (NEXIUM ) 20 MG capsule Take 1 capsule (20 mg total) by mouth daily as needed (acid reflux).   spironolactone  (ALDACTONE ) 25 MG tablet Take 1 tablet (25 mg total) by mouth daily.   [START ON 01/26/2024] tirzepatide  (ZEPBOUND ) 10 MG/0.5ML Pen Inject 10 mg into the skin once a week for 28 days.   [START ON 02/23/2024] tirzepatide  (ZEPBOUND ) 12.5 MG/0.5ML Pen Inject 12.5 mg into the skin once a week for 28 days.   [START ON 03/22/2024] tirzepatide  (ZEPBOUND ) 15 MG/0.5ML Pen Inject 15 mg into the skin once a week.   tirzepatide  (ZEPBOUND ) 2.5 MG/0.5ML Pen Inject 2.5 mg into the skin once a week for 28 days.   [START ON 12/01/2023] tirzepatide  (ZEPBOUND ) 5 MG/0.5ML Pen Inject 5 mg into the skin once a week for 28 days.   [START ON 12/29/2023] tirzepatide  (ZEPBOUND ) 7.5 MG/0.5ML Pen Inject 7.5 mg into the skin once a week for 28 days.    vitamin B-12 (CYANOCOBALAMIN) 1000 MCG tablet Take 1,000 mcg by mouth daily.   No facility-administered medications prior to visit.    Review of Systems  Constitutional:  Negative for fatigue and fever.  Respiratory:  Negative for cough and shortness of breath.   Cardiovascular:  Negative for chest pain and leg swelling.  Gastrointestinal:  Negative for abdominal pain.  Musculoskeletal:  Positive for arthralgias and joint swelling.  Neurological:  Negative for dizziness and headaches.       Objective    BP (!) 144/79   Pulse 67   Wt 271 lb 9.6 oz (123.2 kg)   LMP 01/05/2019   BMI 45.20 kg/m    Physical Exam Vitals reviewed.  Constitutional:      Appearance: She is not ill-appearing.  HENT:     Head: Normocephalic.  Eyes:     Conjunctiva/sclera: Conjunctivae normal.  Cardiovascular:     Rate and Rhythm: Normal rate.  Pulmonary:     Effort: Pulmonary effort is normal. No respiratory distress.  Neurological:     Mental Status: She is alert and oriented to person, place, and time.  Psychiatric:        Mood and Affect: Mood normal.        Behavior: Behavior normal.     No results found for any visits on 11/23/23.  Assessment & Plan    Chronic pain of right knee   -  Coordinate with orthopedic surgeon for meniscus repair surgery - Ensure pre-surgical clearance from primary care physician - Discuss time off from work with HR and [employer] for short-term disability coverage  Further follow ups regarding chronic knee pain are not acute and should be held with her PCP.   Return if symptoms worsen or fail to improve.       Trenton Frock, PA-C  [facility] at [facility] [phone] (phone) [phone] (fax)  [facility]

## 2023-11-24 ENCOUNTER — Telehealth: Payer: Self-pay

## 2023-11-24 NOTE — Telephone Encounter (Signed)
 Called pt was advised of Pre-Op clearance Appt scheduled.

## 2023-11-26 ENCOUNTER — Ambulatory Visit: Admitting: Family Medicine

## 2023-11-26 ENCOUNTER — Encounter: Payer: Self-pay | Admitting: Family Medicine

## 2023-11-26 VITALS — BP 130/80 | HR 84 | Temp 98.0°F | Resp 16 | Ht 65.0 in | Wt 272.4 lb

## 2023-11-26 DIAGNOSIS — Z01818 Encounter for other preprocedural examination: Secondary | ICD-10-CM | POA: Diagnosis not present

## 2023-11-26 MED ORDER — ZEPBOUND 10 MG/0.5ML ~~LOC~~ SOAJ: 10.0000 mg | SUBCUTANEOUS | 0 refills | Status: DC

## 2023-11-26 MED ORDER — ZEPBOUND 15 MG/0.5ML ~~LOC~~ SOAJ: 15.0000 mg | SUBCUTANEOUS | 1 refills | Status: DC

## 2023-11-26 MED ORDER — ZEPBOUND 5 MG/0.5ML ~~LOC~~ SOAJ: 5.0000 mg | SUBCUTANEOUS | 0 refills | Status: AC

## 2023-11-26 MED ORDER — ZEPBOUND 7.5 MG/0.5ML ~~LOC~~ SOAJ: 7.5000 mg | SUBCUTANEOUS | 0 refills | Status: AC

## 2023-11-26 MED ORDER — ZEPBOUND 12.5 MG/0.5ML ~~LOC~~ SOAJ: 12.5000 mg | SUBCUTANEOUS | 0 refills | Status: DC

## 2023-11-26 MED ORDER — MELOXICAM 15 MG PO TABS: 15.0000 mg | ORAL_TABLET | Freq: Every day | ORAL | 0 refills | Status: AC

## 2023-11-26 NOTE — Progress Notes (Signed)
 Subjective:   Chief Complaint  Patient presents with   Pre-op Exam    Pre-Op Exam    Tiffany Peterson  is here for a Pre-operative physical at the request of Dr. Leighton Punches.   She  is having R knee replacement surgery at a date TBD for R knee OA.   Personal or family hx of adverse outcome to anesthesia? No  Chipped, cracked, missing, or loose teeth? Yes missing molars Decreased ROM of neck? No  Able to walk up 2 flights of stairs without becoming significantly short of breath or having chest pain? Yes   Revised Goldman Criteria: High Risk Surgery (intraperitoneal, intrathoracic, aortic): No  Ischemic heart disease (Prior MI, +excercise stress test, angina, nitrate use, Qwave): No  History of heart failure: No  History of cerebrovascular disease: No  History of diabetes: No  Insulin therapy for DM: No  Preoperative Cr >2.0: No   Patient Active Problem List   Diagnosis Date Noted   Gastroesophageal reflux disease 03/31/2021   Iron deficiency anemia due to chronic blood loss 12/07/2018   Menorrhagia 12/07/2018   Meralgia paraesthetica, right 12/01/2018   Chest pain in adult 10/12/2018   Anemia 10/11/2018   Oropharyngeal dysphagia 10/03/2018   Morbid obesity (HCC)    Hypersomnia 09/14/2016   Sinus congestion 09/14/2016   Insomnia 09/14/2016   Essential hypertension 08/19/2016   Mild dysplasia of cervix (CIN I) 12/11/2013   Right ovarian cyst 11/13/2013   Past Medical History:  Diagnosis Date   Anemia    Essential hypertension 08/19/2016   Morbid obesity (HCC)    Vaginal Pap smear, abnormal    colposcopy    Past Surgical History:  Procedure Laterality Date   DILITATION & CURRETTAGE/HYSTROSCOPY WITH NOVASURE ABLATION N/A 08/13/2020   Procedure: DILATATION & CURETTAGE/HYSTEROSCOPY WITH NOVASURE ABLATION, paracervical block;  Surgeon: Lenord Radon, MD;  Location: [facility] OR;  Service: Gynecology;  Laterality: N/A;   IUD REMOVAL  08/13/2020   Procedure: INTRAUTERINE DEVICE (IUD)  REMOVAL;  Surgeon: Lenord Radon, MD;  Location: [facility] OR;  Service: Gynecology;;   KNEE ARTHROSCOPY WITH EXCISION BAKER'S CYST     TUBAL LIGATION      Current Outpatient Medications  Medication Sig Dispense Refill   amLODipine  (NORVASC ) 10 MG tablet Take 1 tablet (10 mg total) by mouth daily. 90 tablet 2   esomeprazole  (NEXIUM ) 20 MG capsule Take 1 capsule (20 mg total) by mouth daily as needed (acid reflux). 90 capsule 1   spironolactone  (ALDACTONE ) 25 MG tablet Take 1 tablet (25 mg total) by mouth daily. 90 tablet 0   [START ON 01/26/2024] tirzepatide  (ZEPBOUND ) 10 MG/0.5ML Pen Inject 10 mg into the skin once a week for 28 days. 2 mL 0   [START ON 02/23/2024] tirzepatide  (ZEPBOUND ) 12.5 MG/0.5ML Pen Inject 12.5 mg into the skin once a week for 28 days. 2 mL 0   [START ON 03/22/2024] tirzepatide  (ZEPBOUND ) 15 MG/0.5ML Pen Inject 15 mg into the skin once a week. 6 mL 1   tirzepatide  (ZEPBOUND ) 2.5 MG/0.5ML Pen Inject 2.5 mg into the skin once a week for 28 days. 2 mL 0   [START ON 12/01/2023] tirzepatide  (ZEPBOUND ) 5 MG/0.5ML Pen Inject 5 mg into the skin once a week for 28 days. 2 mL 0   [START ON 12/29/2023] tirzepatide  (ZEPBOUND ) 7.5 MG/0.5ML Pen Inject 7.5 mg into the skin once a week for 28 days. 2 mL 0   vitamin B-12 (CYANOCOBALAMIN) 1000 MCG tablet Take 1,000 mcg by mouth daily.  Allergies  Allergen Reactions   Lipitor [Atorvastatin] Other (See Comments)    Throat swelling/sinus issues    Family History  Problem Relation Age of Onset   Stroke Maternal Aunt    Diabetes Father    Hypertension Father    Diabetes Mother    Hypertension Mother    Cancer Neg Hx    Heart attack Neg Hx    Colon cancer Neg Hx    Esophageal cancer Neg Hx      Review of Systems:  Constitutional:  no fevers Eye:  no recent significant change in vision Ear:  no hearing loss Nose/Mouth/Throat:  No dental complaints Neck/Thyroid:  no lumps or masses Pulmonary:  No shortness of  breath Cardiovascular:  no chest pain Gastrointestinal:  no abdominal pain GU:  negative for dysuria Musculoskeletal/Extremities:  no pain Skin/Integumentary ROS:  no abnormal skin lesions reported Neurologic:  no HA   Objective:   Vitals:   11/26/23 1333  BP: 130/80  Pulse: 84  Resp: 16  Temp: 98 F (36.7 C)  TempSrc: Oral  SpO2: 98%  Weight: 272 lb 6.4 oz (123.6 kg)  Height: 5' 5 (1.651 m)   Body mass index is 45.33 kg/m.  General:  well developed, well nourished, in no apparent distress Skin:  warm, no pallor or diaphoresis Head:  normocephalic, atraumatic Eyes:  pupils equal and round, sclera anicteric without injection Ears:  canals without lesions, TMs shiny without retraction, no obvious effusion, no erythema Throat/Pharynx:  lips and gingiva without lesion; tongue and uvula midline; non-inflamed pharynx; no exudates or postnasal drainage; missing molars of L mand/max and R max area.  Neck: neck supple without adenopathy, thyromegaly, or masses, no bruits, no jugular venous distention Lungs:  clear to auscultation, breath sounds equal bilaterally, no respiratory distress Cardio:  regular rate and rhythm without murmurs Musculoskeletal:  symmetrical muscle groups noted without atrophy or deformity Extremities:  no clubbing, cyanosis, or edema, no deformities, no skin discoloration Neuro:  gait antalgic Psych: Age appropriate judgment and insight; normal mood   Assessment:   Preop examination - Plan: Comprehensive metabolic panel with GFR, CANCELED: Comprehensive metabolic panel with GFR   Plan:   No NSAIDs w/in 5 d of procedure. She will hold her Zepbound  for at least 1 full week prior to her procedure.  The above laboratory work was ordered and will be sent with this physical. Pending the above workup, the patient is deemed low cardiac risk for the proposed procedure.  The patient voiced understanding and agreement to the plan.  Shellie Dials [place],  DO 11/26/23  3:32 PM

## 2023-11-26 NOTE — Patient Instructions (Addendum)
 Do not take your injection within 1 week or your scheduled procedure.   No meloxicam  or NSAID within 5 days of procedure.   Give us  2-3 business days to get the results of your labs back.   Keep the diet clean and stay active.  Let us  know if you need anything.

## 2023-11-27 ENCOUNTER — Ambulatory Visit: Payer: Self-pay | Admitting: Family Medicine

## 2023-11-27 LAB — COMPREHENSIVE METABOLIC PANEL WITH GFR
AG Ratio: 1.2 (calc) (ref 1.0–2.5)
ALT: 13 U/L (ref 6–29)
AST: 15 U/L (ref 10–35)
Albumin: 4.1 g/dL (ref 3.6–5.1)
Alkaline phosphatase (APISO): 93 U/L (ref 37–153)
BUN: 16 mg/dL (ref 7–25)
CO2: 24 mmol/L (ref 20–32)
Calcium: 9.4 mg/dL (ref 8.6–10.4)
Chloride: 104 mmol/L (ref 98–110)
Creat: 0.92 mg/dL (ref 0.50–1.03)
Globulin: 3.3 g/dL (ref 1.9–3.7)
Glucose, Bld: 90 mg/dL (ref 65–99)
Potassium: 4.4 mmol/L (ref 3.5–5.3)
Sodium: 139 mmol/L (ref 135–146)
Total Bilirubin: 0.4 mg/dL (ref 0.2–1.2)
Total Protein: 7.4 g/dL (ref 6.1–8.1)
eGFR: 74 mL/min/{1.73_m2} (ref >=60)

## 2023-12-10 DIAGNOSIS — Y999 Unspecified external cause status: Secondary | ICD-10-CM | POA: Diagnosis not present

## 2023-12-10 DIAGNOSIS — X58XXXA Exposure to other specified factors, initial encounter: Secondary | ICD-10-CM | POA: Diagnosis not present

## 2023-12-10 DIAGNOSIS — M1711 Unilateral primary osteoarthritis, right knee: Secondary | ICD-10-CM | POA: Diagnosis not present

## 2023-12-10 DIAGNOSIS — G8918 Other acute postprocedural pain: Secondary | ICD-10-CM | POA: Diagnosis not present

## 2023-12-10 DIAGNOSIS — M94261 Chondromalacia, right knee: Secondary | ICD-10-CM | POA: Diagnosis not present

## 2023-12-10 DIAGNOSIS — S83011A Lateral subluxation of right patella, initial encounter: Secondary | ICD-10-CM | POA: Diagnosis not present

## 2023-12-10 DIAGNOSIS — S83281A Other tear of lateral meniscus, current injury, right knee, initial encounter: Secondary | ICD-10-CM | POA: Diagnosis not present

## 2023-12-10 DIAGNOSIS — M2241 Chondromalacia patellae, right knee: Secondary | ICD-10-CM | POA: Diagnosis not present

## 2023-12-10 DIAGNOSIS — M228X1 Other disorders of patella, right knee: Secondary | ICD-10-CM | POA: Diagnosis not present

## 2023-12-15 ENCOUNTER — Encounter: Payer: Self-pay | Admitting: Family Medicine

## 2024-01-04 ENCOUNTER — Telehealth: Payer: Self-pay

## 2024-01-04 ENCOUNTER — Other Ambulatory Visit: Payer: Self-pay | Admitting: Family Medicine

## 2024-01-04 NOTE — Telephone Encounter (Signed)
 done

## 2024-01-05 ENCOUNTER — Ambulatory Visit (INDEPENDENT_AMBULATORY_CARE_PROVIDER_SITE_OTHER): Admitting: Family Medicine

## 2024-01-05 ENCOUNTER — Encounter: Payer: Self-pay | Admitting: Family Medicine

## 2024-01-05 NOTE — Patient Instructions (Addendum)
 Consider chair yoga.  Keep the diet clean and stay active.  Let us  know if you need anything.  Healthy Eating Plan Many factors influence your heart health, including eating and exercise habits. Heart (coronary) risk increases with abnormal blood fat (lipid) levels. Heart-healthy meal planning includes limiting unhealthy fats, increasing healthy fats, and making other small dietary changes. This includes maintaining a healthy body weight to help keep lipid levels within a normal range.  WHAT IS MY PLAN?  Your health care provider recommends that you: Drink a glass of water before meals to help with satiety. Eat slowly. An alternative to the water is to add Metamucil. This will help with satiety as well. It does contain calories, unlike water.  WHAT TYPES OF FAT SHOULD I CHOOSE? Choose healthy fats more often. Choose monounsaturated and polyunsaturated fats, such as olive oil and canola oil, flaxseeds, walnuts, almonds, and seeds. Eat more omega-3 fats. Good choices include salmon, mackerel, sardines, tuna, flaxseed oil, and ground flaxseeds. Aim to eat fish at least two times each week. Avoid foods with partially hydrogenated oils in them. These contain trans fats. Examples of foods that contain trans fats are stick margarine, some tub margarines, cookies, crackers, and other baked goods. If you are going to avoid a fat, this is the one to avoid!  WHAT GENERAL GUIDELINES DO I NEED TO FOLLOW? Check food labels carefully to identify foods with trans fats. Avoid these types of options when possible. Fill one half of your plate with vegetables and green salads. Eat 4-5 servings of vegetables per day. A serving of vegetables equals 1 cup of raw leafy vegetables,  cup of raw or cooked cut-up vegetables, or  cup of vegetable juice. Fill one fourth of your plate with whole grains. Look for the word whole as the first word in the ingredient list. Fill one fourth of your plate with lean protein  foods. Eat 4-5 servings of fruit per day. A serving of fruit equals one medium whole fruit,  cup of dried fruit,  cup of fresh, frozen, or canned fruit. Try to avoid fruits in cups/syrups as the sugar content can be high. Eat more foods that contain soluble fiber. Examples of foods that contain this type of fiber are apples, broccoli, carrots, beans, peas, and barley. Aim to get 20-30 g of fiber per day. Eat more home-cooked food and less restaurant, buffet, and fast food. Limit or avoid alcohol. Limit foods that are high in starch and sugar. Avoid fried foods when able. Cook foods by using methods other than frying. Baking, boiling, grilling, and broiling are all great options. Other fat-reducing suggestions include: Removing the skin from poultry. Removing all visible fats from meats. Skimming the fat off of stews, soups, and gravies before serving them. Steaming vegetables in water or broth. Lose weight if you are overweight. Losing just 5-10% of your initial body weight can help your overall health and prevent diseases such as diabetes and heart disease. Increase your consumption of nuts, legumes, and seeds to 4-5 servings per week. One serving of dried beans or legumes equals  cup after being cooked, one serving of nuts equals 1 ounces, and one serving of seeds equals  ounce or 1 tablespoon.  WHAT ARE GOOD FOODS CAN I EAT? Grains Grainy breads (try to find bread that is 3 g of fiber per slice or greater), oatmeal, light popcorn. Whole-grain cereals. Rice and pasta, including brown rice and those that are made with whole wheat. Edamame pasta is  a great alternative to grain pasta. It has a higher protein content. Try to avoid significant consumption of white bread, sugary cereals, or pastries/baked goods.  Vegetables All vegetables. Cooked white potatoes do not count as vegetables.  Fruits All fruits, but limit pineapple and bananas as these fruits have a higher sugar content.  Meats  and Other Protein Sources Lean, well-trimmed beef, veal, pork, and lamb. Chicken and [place] without skin. All fish and shellfish. Wild duck, rabbit, pheasant, and venison. Egg whites or low-cholesterol egg substitutes. Dried beans, peas, lentils, and tofu. Seeds and most nuts.  Dairy Low-fat or nonfat cheeses, including ricotta, string, and mozzarella. Skim or 1% milk that is liquid, powdered, or evaporated. Buttermilk that is made with low-fat milk. Nonfat or low-fat yogurt. Soy/Almond milk are good alternatives if you cannot handle dairy.  Beverages Water is the best for you. Sports drinks with less sugar are more desirable unless you are a highly active athlete.  Sweets and Desserts Sherbets and fruit ices. Honey, jam, marmalade, jelly, and syrups. Dark chocolate.  Eat all sweets and desserts in moderation.  Fats and Oils Nonhydrogenated (trans-free) margarines. Vegetable oils, including soybean, sesame, sunflower, olive, peanut, safflower, corn, canola, and cottonseed. Salad dressings or mayonnaise that are made with a vegetable oil. Limit added fats and oils that you use for cooking, baking, salads, and as spreads.  Other Cocoa powder. Coffee and tea. Most condiments.  The items listed above may not be a complete list of recommended foods or beverages. Contact your dietitian for more options.

## 2024-01-05 NOTE — Progress Notes (Signed)
 Chief Complaint  Patient presents with   Follow-up    Follow Up     Subjective: Patient is a 53 y.o. female here for f/u weight loss.  Patient was started on Zepbound  around 2 months ago.  She just started the 5 mg weekly dosage.  She stopped it for 3 weeks due to surgery.  Otherwise compliant, no adverse effects.  She has thus far lost 3-4 pounds.  Diet is OK. Cravings decreased. Slight decrease in appetite. She is not exercising due to knee issues which she is following the ortho team for.   Past Medical History:  Diagnosis Date   Anemia    Essential hypertension 08/19/2016   Morbid obesity (HCC)    Vaginal Pap smear, abnormal    colposcopy    Objective: BP 134/76 (BP Location: Left Arm, Patient Position: Sitting)   Pulse 96   Temp 98 F (36.7 C) (Oral)   Resp 16   Ht 5' 5 (1.651 m)   Wt 269 lb 3.2 oz (122.1 kg)   LMP 01/05/2019   SpO2 96%   BMI 44.80 kg/m  General: Awake, appears stated age Heart: RRR, no LE edema Lungs: CTAB, no rales, wheezes or rhonchi. No accessory muscle use Psych: Age appropriate judgment and insight, normal affect and mood  Assessment and Plan: Morbid obesity (HCC)  Chronic, not stable.  Continue dosage escalation of Zepbound .  Currently on 5 mg weekly and will increase to 7.5 mg in a couple weeks.  I will see her back in 6 weeks as this will mark the 18-month milestone and likely dictate whether insurance continues to cover this or not.  Counseled on diet and exercise.  Encouraged seated exercise options like chair yoga or resistance bands. The patient voiced understanding and agreement to the plan.  Mabel [place], DO 01/05/24  8:08 AM

## 2024-01-11 DIAGNOSIS — M25561 Pain in right knee: Secondary | ICD-10-CM | POA: Diagnosis not present

## 2024-01-18 DIAGNOSIS — Z4889 Encounter for other specified surgical aftercare: Secondary | ICD-10-CM | POA: Diagnosis not present

## 2024-01-21 DIAGNOSIS — M25561 Pain in right knee: Secondary | ICD-10-CM | POA: Diagnosis not present

## 2024-01-24 DIAGNOSIS — M25561 Pain in right knee: Secondary | ICD-10-CM | POA: Diagnosis not present

## 2024-01-27 DIAGNOSIS — M25561 Pain in right knee: Secondary | ICD-10-CM | POA: Diagnosis not present

## 2024-02-01 DIAGNOSIS — M25561 Pain in right knee: Secondary | ICD-10-CM | POA: Diagnosis not present

## 2024-02-04 DIAGNOSIS — M25561 Pain in right knee: Secondary | ICD-10-CM | POA: Diagnosis not present

## 2024-02-08 DIAGNOSIS — M25561 Pain in right knee: Secondary | ICD-10-CM | POA: Diagnosis not present

## 2024-02-11 DIAGNOSIS — M25561 Pain in right knee: Secondary | ICD-10-CM | POA: Diagnosis not present

## 2024-02-16 ENCOUNTER — Ambulatory Visit (INDEPENDENT_AMBULATORY_CARE_PROVIDER_SITE_OTHER): Admitting: Family Medicine

## 2024-02-16 ENCOUNTER — Encounter: Payer: Self-pay | Admitting: Family Medicine

## 2024-02-16 MED ORDER — WEGOVY 2.4 MG/0.75ML ~~LOC~~ SOAJ: 2.4000 mg | SUBCUTANEOUS | 2 refills | Status: AC

## 2024-02-16 MED ORDER — CYCLOBENZAPRINE HCL 10 MG PO TABS: 10.0000 mg | ORAL_TABLET | Freq: Three times a day (TID) | ORAL | Status: AC | PRN

## 2024-02-16 MED ORDER — WEGOVY 1.7 MG/0.75ML ~~LOC~~ SOAJ
1.7000 mg | SUBCUTANEOUS | 0 refills | Status: AC
Start: 2024-02-16 — End: 2024-03-15

## 2024-02-16 NOTE — Progress Notes (Signed)
 Chief Complaint  Patient presents with   Follow-up     Follow Up    Subjective: Patient is a 53 y.o. female here for follow-up.  Patient currently taking his Zepbound  10 mg weekly.  Compliant, no adverse effects.  She has not really lost any weight but does feel she is losing inches.  Appetite slightly decreased.  Cravings unchanged.  She does physical therapy and home exercise for her physical activity.  Daughter is on Ozempic and did very well losing 55 pounds.  She did have an episode of lightheadedness in 8.  It helped a little bit but not fully.  She drinks around 20-30 ounces of water daily.  Past Medical History:  Diagnosis Date   Anemia    Essential hypertension 08/19/2016   Morbid obesity (HCC)    Vaginal Pap smear, abnormal    colposcopy    Objective: BP 126/78 (BP Location: Left Arm, Patient Position: Sitting)   Pulse 85   Temp 98 F (36.7 C) (Oral)   Resp 16   Ht 5' 5 (1.651 m)   Wt 273 lb (123.8 kg)   LMP 01/05/2019   SpO2 98%   BMI 45.43 kg/m  General: Awake, appears stated age Lungs: No accessory muscle use Psych: Age appropriate judgment and insight, normal affect and mood  Assessment and Plan: Morbid obesity (HCC) - Plan: semaglutide -weight management (WEGOVY ) 1.7 MG/0.75ML SOAJ SQ injection, semaglutide -weight management (WEGOVY ) 2.4 MG/0.75ML SOAJ SQ injection  Chronic, not controlled.  Needs to improve hydration.  Lightheadedness probably associated with that.  That way she will force herself to eat.  She will finish her dosage of Mounjaro  and then transition to Wegovy  1.7 mg weekly for 4 weeks and then increase to 2.4 mg weekly.  Recommended doing strength training for the upper body while seated since she is having knee pain.  I would like to see her in 2 months for physical. The patient voiced understanding and agreement to the plan.  Mabel [place], DO 02/16/24  7:54 AM

## 2024-02-16 NOTE — Patient Instructions (Addendum)
 Keep the diet clean and stay active.  Please consider adding some weight resistance exercise to your routine. Consider yoga as well.   Try to drink 55-60 oz of water daily outside of exercise.  Send me a message at the end of your 10 mg dosage of Zepbound  if you wish to stay on it.   Let us  know if you need anything.

## 2024-02-17 DIAGNOSIS — M25561 Pain in right knee: Secondary | ICD-10-CM | POA: Diagnosis not present

## 2024-02-24 DIAGNOSIS — M25561 Pain in right knee: Secondary | ICD-10-CM | POA: Diagnosis not present

## 2024-02-29 DIAGNOSIS — M25561 Pain in right knee: Secondary | ICD-10-CM | POA: Diagnosis not present

## 2024-03-02 DIAGNOSIS — M25561 Pain in right knee: Secondary | ICD-10-CM | POA: Diagnosis not present

## 2024-03-07 DIAGNOSIS — M25561 Pain in right knee: Secondary | ICD-10-CM | POA: Diagnosis not present

## 2024-03-09 DIAGNOSIS — M25561 Pain in right knee: Secondary | ICD-10-CM | POA: Diagnosis not present

## 2024-03-14 DIAGNOSIS — M25561 Pain in right knee: Secondary | ICD-10-CM | POA: Diagnosis not present

## 2024-03-20 DIAGNOSIS — Z4789 Encounter for other orthopedic aftercare: Secondary | ICD-10-CM | POA: Diagnosis not present

## 2024-03-20 DIAGNOSIS — M25561 Pain in right knee: Secondary | ICD-10-CM | POA: Diagnosis not present

## 2024-04-05 ENCOUNTER — Ambulatory Visit (HOSPITAL_COMMUNITY)

## 2024-04-05 ENCOUNTER — Other Ambulatory Visit (HOSPITAL_COMMUNITY): Payer: Self-pay | Admitting: Specialist

## 2024-04-05 DIAGNOSIS — M79604 Pain in right leg: Secondary | ICD-10-CM

## 2024-04-05 DIAGNOSIS — M7989 Other specified soft tissue disorders: Secondary | ICD-10-CM | POA: Diagnosis not present

## 2024-04-05 DIAGNOSIS — M25561 Pain in right knee: Secondary | ICD-10-CM | POA: Diagnosis not present

## 2024-04-06 ENCOUNTER — Ambulatory Visit (HOSPITAL_COMMUNITY)
Admission: RE | Admit: 2024-04-06 | Discharge: 2024-04-06 | Disposition: A | Discharge status: Home / Self Care | Source: Ambulatory Visit | Attending: Surgery | Admitting: Surgery

## 2024-04-06 DIAGNOSIS — M79604 Pain in right leg: Secondary | ICD-10-CM | POA: Insufficient documentation

## 2024-04-12 ENCOUNTER — Other Ambulatory Visit (HOSPITAL_COMMUNITY): Payer: Self-pay

## 2024-04-13 ENCOUNTER — Other Ambulatory Visit (HOSPITAL_COMMUNITY): Payer: Self-pay

## 2024-04-17 ENCOUNTER — Encounter: Admitting: Family Medicine
